# Patient Record
Sex: Female | Born: 1995
Health system: Southern US, Community
[De-identification: ages and names within clinical notes are randomized; demographics above are authoritative.]

## PROBLEM LIST (undated history)

## (undated) DIAGNOSIS — F909 Attention-deficit hyperactivity disorder, unspecified type: Secondary | ICD-10-CM

## (undated) DIAGNOSIS — E785 Hyperlipidemia, unspecified: Secondary | ICD-10-CM

## (undated) DIAGNOSIS — F32A Depression, unspecified: Secondary | ICD-10-CM

## (undated) DIAGNOSIS — Q796 Ehlers-Danlos syndrome, unspecified: Secondary | ICD-10-CM

## (undated) DIAGNOSIS — N83209 Unspecified ovarian cyst, unspecified side: Secondary | ICD-10-CM

## (undated) DIAGNOSIS — L309 Dermatitis, unspecified: Secondary | ICD-10-CM

## (undated) DIAGNOSIS — F419 Anxiety disorder, unspecified: Secondary | ICD-10-CM

## (undated) DIAGNOSIS — E282 Polycystic ovarian syndrome: Secondary | ICD-10-CM

## (undated) DIAGNOSIS — F319 Bipolar disorder, unspecified: Secondary | ICD-10-CM

## (undated) HISTORY — PX: KNEE SURGERY: SHX244

---
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
## 2017-01-31 ENCOUNTER — Encounter (HOSPITAL_COMMUNITY): Payer: Self-pay | Admitting: Emergency Medicine

## 2017-01-31 DIAGNOSIS — M542 Cervicalgia: Secondary | ICD-10-CM | POA: Insufficient documentation

## 2017-01-31 DIAGNOSIS — Z041 Encounter for examination and observation following transport accident: Secondary | ICD-10-CM | POA: Diagnosis not present

## 2017-01-31 DIAGNOSIS — M7918 Myalgia, other site: Secondary | ICD-10-CM | POA: Diagnosis not present

## 2017-01-31 DIAGNOSIS — R51 Headache: Secondary | ICD-10-CM | POA: Insufficient documentation
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
## 2017-02-01 ENCOUNTER — Emergency Department (HOSPITAL_COMMUNITY)
Admission: EM | Admit: 2017-02-01 | Discharge: 2017-02-01 | Disposition: A | Discharge status: Home / Self Care | Payer: BLUE CROSS/BLUE SHIELD | Attending: Emergency Medicine | Admitting: Emergency Medicine

## 2017-02-01 DIAGNOSIS — M7918 Myalgia, other site: Secondary | ICD-10-CM

## 2017-02-01 HISTORY — DX: Ehlers-Danlos syndrome, unspecified: Q79.60

## 2017-02-01 MED ORDER — CYCLOBENZAPRINE HCL 10 MG PO TABS: 10.0000 mg | ORAL_TABLET | Freq: Two times a day (BID) | ORAL | 0 refills | Status: DC | PRN

## 2017-02-01 MED ORDER — IBUPROFEN 600 MG PO TABS: 600.0000 mg | ORAL_TABLET | Freq: Four times a day (QID) | ORAL | 0 refills | Status: DC | PRN
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
## 2017-04-16 DIAGNOSIS — Z862 Personal history of diseases of the blood and blood-forming organs and certain disorders involving the immune mechanism: Secondary | ICD-10-CM

## 2017-04-16 HISTORY — DX: Personal history of diseases of the blood and blood-forming organs and certain disorders involving the immune mechanism: Z86.2

## 2017-04-19 DIAGNOSIS — F321 Major depressive disorder, single episode, moderate: Secondary | ICD-10-CM | POA: Diagnosis not present

## 2017-04-19 DIAGNOSIS — F4010 Social phobia, unspecified: Secondary | ICD-10-CM | POA: Diagnosis not present

## 2017-04-25 ENCOUNTER — Ambulatory Visit (INDEPENDENT_AMBULATORY_CARE_PROVIDER_SITE_OTHER): Payer: BLUE CROSS/BLUE SHIELD | Admitting: Family Medicine

## 2017-04-25 ENCOUNTER — Ambulatory Visit: Payer: Self-pay | Admitting: *Deleted

## 2017-04-25 ENCOUNTER — Encounter: Payer: Self-pay | Admitting: Family Medicine

## 2017-04-25 ENCOUNTER — Other Ambulatory Visit: Payer: Self-pay

## 2017-04-25 VITALS — BP 112/60 | HR 98 | Temp 98.3°F | Ht 71.65 in | Wt 174.0 lb

## 2017-04-25 DIAGNOSIS — F329 Major depressive disorder, single episode, unspecified: Secondary | ICD-10-CM

## 2017-04-25 DIAGNOSIS — F419 Anxiety disorder, unspecified: Secondary | ICD-10-CM

## 2017-04-25 DIAGNOSIS — F32A Depression, unspecified: Secondary | ICD-10-CM

## 2017-04-25 MED ORDER — VENLAFAXINE HCL 37.5 MG PO TABS: 37.5000 mg | ORAL_TABLET | Freq: Every day | ORAL | 1 refills | Status: DC

## 2017-04-25 MED ORDER — HYDROXYZINE HCL 25 MG PO TABS: 25.0000 mg | ORAL_TABLET | Freq: Every evening | ORAL | 1 refills | Status: DC | PRN

## 2017-04-25 NOTE — Telephone Encounter (Signed)
Pt states that she was prescribed effexor today and took her 1st dose at 1330 and about 10 minutes after taking the medication she had an episode of nausea and vomiting; she further states that now she feels shaky; nurse triage initiated and [place] and spoke with [place] and she will route request to Dr Pamella Pert and someone will call her back; when triage RN attempted to relay this information to the pt, her friend states that pt was vomiting again and the call disconnected; attempted to contact pt again at [phone] but phone went straight to voice mail but pt called back; conference call initiated with [place] and she verbalized that the pt was to have started the medication tomorrow after eating a good meal; pt states that all she had today was a poptart;  [place] instructed the pt, per Dr Pamella Pert, to take the medication again tomorrow and make sure she eats a good meal before taking the medication; pt verbalizes understanding and will call back if she has any other problems with the medication.  Reason for Disposition . Vomiting a prescription medication  Answer Assessment - Initial Assessment Questions 1. VOMITING SEVERITY: "How many times have you vomited in the past 24 hours?"     - MILD:  1 - 2 times/day    - MODERATE: 3 - 5 times/day, decreased oral intake without significant weight loss or symptoms of dehydration    - SEVERE: 6 or more times/day, vomits everything or nearly everything, with significant weight loss, symptoms of dehydration      mild 2. ONSET: "When did the vomiting begin?"      30 minutes after taking effexor for the first time  3. FLUIDS: "What fluids or food have you vomited up today?" "Have you been able to keep any fluids down?"     "Not much"; just liquid not food 4. ABDOMINAL PAIN: "Are your having any abdominal pain?" If yes : "How bad is it and what does it feel like?" (e.g., crampy, dull, intermittent, constant)      no 5. DIARRHEA: "Is there any diarrhea?" If so,  ask: "How many times today?"      no 6. CONTACTS: "Is there anyone else in the family with the same symptoms?"      no 7. CAUSE: "What do you think is causing your vomiting?"     ? First dose of medication  8. HYDRATION STATUS: "Any signs of dehydration?" (e.g., dry mouth [not only dry lips], too weak to stand) "When did you last urinate?"     no 9. OTHER SYMPTOMS: "Do you have any other symptoms?" (e.g., fever, headache, vertigo, vomiting blood or coffee grounds, recent head injury)     shaky 10. PREGNANCY: "Is there any chance you are pregnant?" "When was your last menstrual period?"      No pt is on birth control pills  Protocols used: [facility]

## 2017-04-25 NOTE — Patient Instructions (Signed)
     IF you received an x-ray today, you will receive an invoice from [facility]. Please contact [facility] at [phone] with questions or concerns regarding your invoice.   IF you received labwork today, you will receive an invoice from [facility]. Please contact [facility] at [phone] with questions or concerns regarding your invoice.   Our billing staff will not be able to assist you with questions regarding bills from these companies.  You will be contacted with the lab results as soon as they are available. The fastest way to get your results is to activate your My Chart account. Instructions are located on the last page of this paperwork. If you have not heard from us regarding the results in 2 weeks, please contact this office.        IF you received an x-ray today, you will receive an invoice from [facility]. Please contact [facility] at [phone] with questions or concerns regarding your invoice.   IF you received labwork today, you will receive an invoice from [facility]. Please contact [facility] at [phone] with questions or concerns regarding your invoice.   Our billing staff will not be able to assist you with questions regarding bills from these companies.  You will be contacted with the lab results as soon as they are available. The fastest way to get your results is to activate your My Chart account. Instructions are located on the last page of this paperwork. If you have not heard from us regarding the results in 2 weeks, please contact this office.     
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
## 2017-04-29 DIAGNOSIS — F4010 Social phobia, unspecified: Secondary | ICD-10-CM | POA: Diagnosis not present

## 2017-04-29 DIAGNOSIS — F321 Major depressive disorder, single episode, moderate: Secondary | ICD-10-CM | POA: Diagnosis not present

## 2017-05-08 DIAGNOSIS — F321 Major depressive disorder, single episode, moderate: Secondary | ICD-10-CM | POA: Diagnosis not present

## 2017-05-08 DIAGNOSIS — F4010 Social phobia, unspecified: Secondary | ICD-10-CM | POA: Diagnosis not present

## 2017-05-09 ENCOUNTER — Encounter: Payer: Self-pay | Admitting: Family Medicine

## 2017-05-09 ENCOUNTER — Ambulatory Visit (INDEPENDENT_AMBULATORY_CARE_PROVIDER_SITE_OTHER): Payer: BLUE CROSS/BLUE SHIELD | Admitting: Family Medicine

## 2017-05-09 ENCOUNTER — Telehealth: Payer: Self-pay | Admitting: General Practice

## 2017-05-09 ENCOUNTER — Other Ambulatory Visit: Payer: Self-pay

## 2017-05-09 VITALS — BP 104/78 | HR 96 | Temp 98.5°F | Ht 71.26 in | Wt 177.0 lb

## 2017-05-09 DIAGNOSIS — F329 Major depressive disorder, single episode, unspecified: Secondary | ICD-10-CM

## 2017-05-09 DIAGNOSIS — F32A Depression, unspecified: Secondary | ICD-10-CM | POA: Insufficient documentation

## 2017-05-09 DIAGNOSIS — M25562 Pain in left knee: Secondary | ICD-10-CM

## 2017-05-09 DIAGNOSIS — G8929 Other chronic pain: Secondary | ICD-10-CM | POA: Diagnosis not present

## 2017-05-09 DIAGNOSIS — Q796 Ehlers-Danlos syndrome, unspecified: Secondary | ICD-10-CM | POA: Insufficient documentation

## 2017-05-09 DIAGNOSIS — F419 Anxiety disorder, unspecified: Secondary | ICD-10-CM

## 2017-05-09 MED ORDER — CYCLOBENZAPRINE HCL 10 MG PO TABS: 10.0000 mg | ORAL_TABLET | Freq: Two times a day (BID) | ORAL | 0 refills | Status: DC | PRN

## 2017-05-09 MED ORDER — VENLAFAXINE HCL ER 75 MG PO CP24: 75.0000 mg | ORAL_CAPSULE | Freq: Every day | ORAL | 2 refills | Status: DC

## 2017-05-09 NOTE — Telephone Encounter (Signed)
Copied from [place] [id number]. Topic: Quick Communication - See Telephone Encounter >> May 09, 2017  5:32 PM Conception Chancy, NT wrote: CRM for notification. See Telephone encounter for:  05/09/17.  Pt was seen today and her Venlafaxine 37.5mg  was changed to Venlafaxine 75mg  and the pharmacy told her they would not have the 75mg  for about 2 days. She would like to know if she is supposed to take her 37.5x2 until she gets her 75mg . please contact patient,

## 2017-05-09 NOTE — Progress Notes (Signed)
1/24/20191:40 PM  [facility] 1995-10-11, 22 y.o. female [id number]  Chief Complaint  Patient presents with  . Follow-up    anxiety and depression. Having trouble sleeping, saying medication is not giving her the same reacation as first taking.     HPI:   Patient is a 22 y.o. female who presents today for followup on depression and anxiety.  She is tolerating effexor well as long as she takes it with food She notices a change in mood towards evening when morning dose starts to wear off She denies any worsening in anxiety She states that in general she continues to have mood swings She is escalating therapy to twice a week She is seriously considering taking a medical leave of absence from college. She states that her GPA is fine, 3.9, but the pressure is building, she feels very vulnerable and that she really needs to take time to work on her mental health She is also considering moving to [place] as she has good friends there, while she knows nobody here in [place] She would need a medical letter  She is also requesting refill of flexeril which really helps with her chronic left knee pain.   Depression screen [facility] 2/9 05/09/2017 04/25/2017  Decreased Interest 0 1  Down, Depressed, Hopeless 0 1  PHQ - 2 Score 0 2  Altered sleeping - 3  Tired, decreased energy - 3  Change in appetite - 0  Feeling bad or failure about yourself  - 3  Trouble concentrating - 2  Moving slowly or fidgety/restless - 3  Suicidal thoughts - 1  PHQ-9 Score - 17  Difficult doing work/chores - Very difficult    No Known Allergies  Prior to Admission medications   Medication Sig Start Date End Date Taking? Authorizing Provider  cyclobenzaprine (FLEXERIL) 10 MG tablet Take 1 tablet (10 mg total) by mouth 2 (two) times daily as needed for muscle spasms. 02/01/17  Yes Upstill, Nehemiah Settle, PA-C  hydrOXYzine (ATARAX/VISTARIL) 25 MG tablet Take 1 tablet (25 mg total) by mouth at bedtime as needed. 04/25/17   Yes Rutherford Guys, MD  norethindrone-ethinyl estradiol (MICROGESTIN,JUNEL,LOESTRIN) 1-20 MG-MCG tablet Take 1 tablet by mouth daily.   Yes [provider]  venlafaxine (EFFEXOR) 37.5 MG tablet Take 1 tablet (37.5 mg total) by mouth daily. 04/25/17  Yes Rutherford Guys, MD    Past Medical History:  Diagnosis Date  . Ehlers-Danlos disease     Past Surgical History:  Procedure Laterality Date  . KNEE SURGERY      Social History   Tobacco Use  . Smoking status: Never Smoker  . Smokeless tobacco: Never Used  Substance Use Topics  . Alcohol use: Yes    Comment: social    Family History  Problem Relation Age of Onset  . Hypertension Other   . Diabetes Other     ROS Per hpi  OBJECTIVE:  Blood pressure 104/78, pulse 96, temperature 98.5 F (36.9 C), temperature source Oral, height 5' 11.26" (1.81 m), weight 177 lb (80.3 kg), SpO2 96 %.  Physical Exam  Constitutional: She is oriented to person, place, and time and well-developed, well-nourished, and in no distress.  HENT:  Head: Normocephalic and atraumatic.  Mouth/Throat: Mucous membranes are normal.  Eyes: EOM are normal. Pupils are equal, round, and reactive to light. No scleral icterus.  Neck: Neck supple.  Pulmonary/Chest: Effort normal.  Neurological: She is alert and oriented to person, place, and time. Gait normal.  Skin: Skin is  warm and dry.  Psychiatric: Mood and affect normal.  Nursing note and vitals reviewed.    ASSESSMENT and PLAN  1. Anxiety and depression Will cont to titrate effexor. Advised patient to increase to 2 capsules AM after 2 weeks if needed. Continue with counseling. Patient will let me know if she needs a letter for her medical leave of absence.   2. Chronic pain of left knee  3. Ehlers-Danlos disease  Other orders - venlafaxine XR (EFFEXOR-XR) 75 MG 24 hr capsule; Take 1 capsule (75 mg total) by mouth daily with breakfast. - cyclobenzaprine (FLEXERIL) 10 MG tablet; Take  1 tablet (10 mg total) by mouth 2 (two) times daily as needed for muscle spasms.  Return in about 4 weeks (around 06/06/2017).    Rutherford Guys, MD Primary Care at [place], [place] [address] Ph.  [phone] Fax [phone]

## 2017-05-09 NOTE — Patient Instructions (Signed)
     IF you received an x-ray today, you will receive an invoice from [facility]. Please contact [facility] at [phone] with questions or concerns regarding your invoice.   IF you received labwork today, you will receive an invoice from [facility]. Please contact [facility] at [phone] with questions or concerns regarding your invoice.   Our billing staff will not be able to assist you with questions regarding bills from these companies.  You will be contacted with the lab results as soon as they are available. The fastest way to get your results is to activate your My Chart account. Instructions are located on the last page of this paperwork. If you have not heard from us regarding the results in 2 weeks, please contact this office.     

## 2017-05-10 DIAGNOSIS — F4010 Social phobia, unspecified: Secondary | ICD-10-CM | POA: Diagnosis not present

## 2017-05-10 DIAGNOSIS — F321 Major depressive disorder, single episode, moderate: Secondary | ICD-10-CM | POA: Diagnosis not present

## 2017-05-10 NOTE — Telephone Encounter (Signed)
Informed pt that Dr. Pamella Pert states that it is ok for her to take her previous medication until her new Rx is ready. She verbalized understanding.

## 2017-05-14 ENCOUNTER — Ambulatory Visit: Payer: Self-pay | Admitting: *Deleted

## 2017-05-14 DIAGNOSIS — F4010 Social phobia, unspecified: Secondary | ICD-10-CM | POA: Diagnosis not present

## 2017-05-14 DIAGNOSIS — F321 Major depressive disorder, single episode, moderate: Secondary | ICD-10-CM | POA: Diagnosis not present

## 2017-05-14 NOTE — Telephone Encounter (Signed)
I saw Dr. Pamella Pert last week.  We are working on adjusting my medications for anxiety and depression.    I'm seeing my therapist.   My therapist advised I take 2 weeks off to see if my medicine works. As a preventative due to being overwhelmed.   I need to Register with office of disability for [employer].     I need have my medical information sent to me.   The student health center can't give my  Information out to a  3rd party.    Can Dr. Pamella Pert send it to me via my E mail?    That would work for me.   I'm trying to get this done by the end of this week.  I'm wanting to take a break next week.   She is active on MyChart too.   I routed this note to Dr. Ardyth Gal nurse pool.

## 2017-05-15 ENCOUNTER — Telehealth: Payer: Self-pay | Admitting: Family Medicine

## 2017-05-15 NOTE — Telephone Encounter (Signed)
Will send pt a mychart message about this

## 2017-05-15 NOTE — Telephone Encounter (Signed)
mychart message sent to pt

## 2017-05-16 DIAGNOSIS — F4010 Social phobia, unspecified: Secondary | ICD-10-CM | POA: Diagnosis not present

## 2017-05-16 DIAGNOSIS — F321 Major depressive disorder, single episode, moderate: Secondary | ICD-10-CM | POA: Diagnosis not present

## 2017-05-16 NOTE — Telephone Encounter (Signed)
Copied from [place]. Topic: Inquiry >> May 16, 2017  1:30 PM Neva Seat wrote: Pt is needing a note that she spoke with Dr. Pamella Pert about in her last visit.  Pt said Dr. Pamella Pert stated she would write a note to give pt time away from school for 2 weeks to take care of her issues. Pt is REALLY NEEDING this note by the end of this week.    Please call pt back to let her know the status and when the note is ready.  Pt prefers an email note.

## 2017-05-16 NOTE — Telephone Encounter (Signed)
Copied from [place]. Topic: Inquiry >> May 16, 2017  1:30 PM Neva Seat wrote: Pt is needing a note that she spoke with Dr. Pamella Pert about in her last visit.  Pt said Dr. Pamella Pert stated she would write a note to give pt time away from school for 2 weeks to take care of her issues. Pt is REALLY NEEDING this note by the end of this week.    Please call pt back to let her know the status and when the note is ready.  Pt prefers an email note.  >> May 16, 2017  1:39 PM Neva Seat wrote: Summary: Pt needing an absent note for 2 weeks. Discussed at recent visit.  Pt is needing a note that she spoke with Dr. Pamella Pert about in her last visit. Pt said Dr. Pamella Pert stated she would write a note to give pt time away from school for 2 weeks to take care of her issues.  Pt is REALLY NEEDING this note by the end of this week.     Please call pt back to let her know the status and when the note is ready. Pt prefers an email note.

## 2017-05-17 ENCOUNTER — Encounter: Payer: Self-pay | Admitting: Family Medicine

## 2017-05-17 NOTE — Telephone Encounter (Signed)
Letter done and ready for pickup , patient notified by [facility]

## 2017-05-27 ENCOUNTER — Other Ambulatory Visit: Payer: Self-pay

## 2017-05-27 MED ORDER — HYDROXYZINE HCL 25 MG PO TABS: 25.0000 mg | ORAL_TABLET | Freq: Every evening | ORAL | 1 refills | Status: DC | PRN

## 2017-05-27 MED ORDER — VENLAFAXINE HCL ER 75 MG PO CP24: 75.0000 mg | ORAL_CAPSULE | Freq: Every day | ORAL | 0 refills | Status: DC

## 2017-05-27 MED ORDER — CYCLOBENZAPRINE HCL 10 MG PO TABS: 10.0000 mg | ORAL_TABLET | Freq: Two times a day (BID) | ORAL | 0 refills | Status: DC | PRN

## 2017-05-27 NOTE — Progress Notes (Unsigned)
Medication refill Hydroxyzine and Velafaxine given 90 day per Dr. Pamella Pert' approval

## 2017-06-03 ENCOUNTER — Encounter: Payer: Self-pay | Admitting: Nurse Practitioner

## 2017-06-03 ENCOUNTER — Ambulatory Visit: Payer: BLUE CROSS/BLUE SHIELD | Admitting: Nurse Practitioner

## 2017-06-03 ENCOUNTER — Ambulatory Visit: Payer: Self-pay | Admitting: *Deleted

## 2017-06-03 VITALS — BP 128/80 | HR 115 | Temp 98.5°F | Ht 71.26 in | Wt 182.0 lb

## 2017-06-03 DIAGNOSIS — F419 Anxiety disorder, unspecified: Secondary | ICD-10-CM

## 2017-06-03 DIAGNOSIS — F329 Major depressive disorder, single episode, unspecified: Secondary | ICD-10-CM | POA: Diagnosis not present

## 2017-06-03 DIAGNOSIS — R42 Dizziness and giddiness: Secondary | ICD-10-CM

## 2017-06-03 DIAGNOSIS — F32A Depression, unspecified: Secondary | ICD-10-CM

## 2017-06-03 MED ORDER — MECLIZINE HCL 12.5 MG PO TABS: 12.5000 mg | ORAL_TABLET | Freq: Two times a day (BID) | ORAL | 0 refills | Status: DC | PRN

## 2017-06-03 MED ORDER — NAPROXEN 375 MG PO TABS: 375.0000 mg | ORAL_TABLET | Freq: Two times a day (BID) | ORAL | 0 refills | Status: DC | PRN

## 2017-06-03 NOTE — Patient Instructions (Addendum)
Hold flexeril and vistaril  She declined any new medication at this time. She will like to discuss changes with pcp. I recommend for you to Take Effexor every other day till next appointment with pcp. Do not stop Effexor use drastically. It is important to wean off medication to minimize withdrawal symptoms. Please call pcp office for appt this week.  Maintain adequate oral hydration.  Dizziness Dizziness is a common problem. It is a feeling of unsteadiness or light-headedness. You may feel like you are about to faint. Dizziness can lead to injury if you stumble or fall. Anyone can become dizzy, but dizziness is more common in older adults. This condition can be caused by a number of things, including medicines, dehydration, or illness. Follow these instructions at home: Eating and drinking  Drink enough fluid to keep your urine clear or pale yellow. This helps to keep you from becoming dehydrated. Try to drink more clear fluids, such as water.  Do not drink alcohol.  Limit your caffeine intake if told to do so by your health care provider. Check ingredients and nutrition facts to see if a food or beverage contains caffeine.  Limit your salt (sodium) intake if told to do so by your health care provider. Check ingredients and nutrition facts to see if a food or beverage contains sodium. Activity  Avoid making quick movements. ? Rise slowly from chairs and steady yourself until you feel okay. ? In the morning, first sit up on the side of the bed. When you feel okay, stand slowly while you hold onto something until you know that your balance is fine.  If you need to stand in one place for a long time, move your legs often. Tighten and relax the muscles in your legs while you are standing.  Do not drive or use heavy machinery if you feel dizzy.  Avoid bending down if you feel dizzy. Place items in your home so that they are easy for you to reach without leaning over. Lifestyle  Do not  use any products that contain nicotine or tobacco, such as cigarettes and e-cigarettes. If you need help quitting, ask your health care provider.  Try to reduce your stress level by using methods such as yoga or meditation. Talk with your health care provider if you need help to manage your stress. General instructions  Watch your dizziness for any changes.  Take over-the-counter and prescription medicines only as told by your health care provider. Talk with your health care provider if you think that your dizziness is caused by a medicine that you are taking.  Tell a friend or a family member that you are feeling dizzy. If he or she notices any changes in your behavior, have this person call your health care provider.  Keep all follow-up visits as told by your health care provider. This is important. Contact a health care provider if:  Your dizziness does not go away.  Your dizziness or light-headedness gets worse.  You feel nauseous.  You have reduced hearing.  You have new symptoms.  You are unsteady on your feet or you feel like the room is spinning. Get help right away if:  You vomit or have diarrhea and are unable to eat or drink anything.  You have problems talking, walking, swallowing, or using your arms, hands, or legs.  You feel generally weak.  You are not thinking clearly or you have trouble forming sentences. It may take a friend or family member to notice  this.  You have chest pain, abdominal pain, shortness of breath, or sweating.  Your vision changes.  You have any bleeding.  You have a severe headache.  You have neck pain or a stiff neck.  You have a fever. These symptoms may represent a serious problem that is an emergency. Do not wait to see if the symptoms will go away. Get medical help right away. Call your local emergency services (911 in the [place] Do not drive yourself to the hospital. Summary  Dizziness is a feeling of unsteadiness or  light-headedness. This condition can be caused by a number of things, including medicines, dehydration, or illness.  Anyone can become dizzy, but dizziness is more common in older adults.  Drink enough fluid to keep your urine clear or pale yellow. Do not drink alcohol.  Avoid making quick movements if you feel dizzy. Monitor your dizziness for any changes. This information is not intended to replace advice given to you by your health care provider. Make sure you discuss any questions you have with your health care provider. Document Released: 09/26/2000 Document Revised: 05/05/2016 Document Reviewed: 05/05/2016 Elsevier Interactive Patient Education  [employer].

## 2017-06-03 NOTE — Telephone Encounter (Signed)
Pt reports lightheadedness x 3 days. Reports H/O hypoglycemia but states "feels different."  Does take Effexor XR but has not taken since Friday. States worse with positional changes, constant. "Feels like my vision has to catch up when I turn my head." Denies SOB, nausea, headache, no cold, sinus symptoms. States is staying hydrated. No openings today at [employer].  Declined UC.  Appt. made for today with Zetta Bills, NP at [facility]. Care advice given per protocol. Instructed to call back if symptoms worsen.  Reason for Disposition . [1] MODERATE dizziness (e.g., interferes with normal activities) AND [2] has NOT been evaluated by physician for this  (Exception: dizziness caused by heat exposure, sudden standing, or poor fluid intake)  Answer Assessment - Initial Assessment Questions 1. DESCRIPTION: "Describe your dizziness."    "Lightheaded 2. LIGHTHEADED: "Do you feel lightheaded?" (e.g., somewhat faint, woozy, weak upon standing)     Positional 3. VERTIGO: "Do you feel like either you or the room is spinning or tilting?" (i.e. vertigo)     No 4. SEVERITY: "How bad is it?"  "Do you feel like you are going to faint?" "Can you stand and walk?"   - MILD - walking normally   - MODERATE - interferes with normal activities (e.g., work, school)    - SEVERE - unable to stand, requires support to walk, feels like passing out now.      Moderate 5. ONSET:  "When did the dizziness begin?"     3 days ago 6. AGGRAVATING FACTORS: "Does anything make it worse?" (e.g., standing, change in head position)    Worse sitting to standing 7. HEART RATE: "Can you tell me your heart rate?" "How many beats in 15 seconds?"  (Note: not all patients can do this)       170 per Apple Watch, she is walking at this time. Had pt rest, 123. 8. CAUSE: "What do you think is causing the dizziness?"     Unsure 9. RECURRENT SYMPTOM: "Have you had dizziness before?" If so, ask: "When was the last time?" "What happened  that time?"    Hypoglycemic episodes; "Doesn't feel like that." 10. OTHER SYMPTOMS: "Do you have any other symptoms?" (e.g., fever, chest pain, vomiting, diarrhea, bleeding)       No 11. PREGNANCY: "Is there any chance you are pregnant?" "When was your last menstrual period?"       No  Protocols used: DIZZINESS [facility]

## 2017-06-03 NOTE — Progress Notes (Signed)
Subjective:  Patient ID: Linda Shea, female    DOB: 1995-08-31  Age: 22 y.o. MRN: [id number]  CC: Dizziness (dizziness 3 day/felt like drunk/headache--today- hx low surgar-didnt take venlafaine 2 day--not sure if this is the cause/taking birth control---no period)  Dizziness  This is a new problem. The current episode started in the past 7 days. The problem occurs intermittently. The problem has been unchanged. Associated symptoms include headaches and vertigo. Pertinent negatives include no abdominal pain, anorexia, arthralgias, change in bowel habit, chest pain, chills, congestion, coughing, diaphoresis, fatigue, fever, joint swelling, myalgias, nausea, neck pain, numbness, rash, sore throat, swollen glands, urinary symptoms, visual change, vomiting or weakness. The symptoms are aggravated by twisting (head movement). She has tried rest for the symptoms. The treatment provided significant relief.  Onset of symptoms on FRiday Describes as Room spinning and unsteadiness. Effexor dose increased 3weeks ago, she skipped dose on Saturday and sunday, then took med today.  Depression and Anxiety: Previous use of prozac and celexa.. Seen by psychology at this time. Still to make appt with psychiatry. FH of depression, OCD, anxiety (mother, father, and sister) Hx of suicide attempt. No hallucination. Denies any SI or HI Denies any previous hospitalization. She is hesitant about trying another medication at this time.  Outpatient Medications Prior to Visit  Medication Sig Dispense Refill  . cyclobenzaprine (FLEXERIL) 10 MG tablet Take 1 tablet (10 mg total) by mouth 2 (two) times daily as needed for muscle spasms. 180 tablet 0  . hydrOXYzine (ATARAX/VISTARIL) 25 MG tablet Take 1 tablet (25 mg total) by mouth at bedtime as needed. 90 tablet 1  . norethindrone-ethinyl estradiol (MICROGESTIN,JUNEL,LOESTRIN) 1-20 MG-MCG tablet Take 1 tablet by mouth daily.    Marland Kitchen venlafaxine XR (EFFEXOR-XR) 75 MG 24  hr capsule Take 1 capsule (75 mg total) by mouth daily with breakfast. 90 capsule 0   No facility-administered medications prior to visit.     ROS See HPI  Objective:  BP 128/80   Pulse (!) 115   Temp 98.5 F (36.9 C)   Ht 5' 11.26" (1.81 m)   Wt 182 lb (82.6 kg)   SpO2 98%   BMI 25.20 kg/m   BP Readings from Last 3 Encounters:  06/03/17 128/80 (91 %, Z = 1.32 /  91 %, Z = 1.37)*  05/09/17 104/78 (8 %, Z = -1.42 /  90 %, Z = 1.29)*  04/25/17 112/60 (35 %, Z = -0.38 /  13 %, Z = -1.15)*   *BP percentiles are based on the August 2017 AAP Clinical Practice Guideline for girls    Wt Readings from Last 3 Encounters:  06/03/17 182 lb (82.6 kg) (95 %, Z= 1.65)*  05/09/17 177 lb (80.3 kg) (94 %, Z= 1.56)*  04/25/17 174 lb (78.9 kg) (93 %, Z= 1.50)*   * Growth percentiles are based on CDC (Girls, 2-20 Years) data.    Physical Exam  Constitutional: She is oriented to person, place, and time.  HENT:  Right Ear: External ear normal. No mastoid tenderness. Tympanic membrane is not injected. No middle ear effusion.  Left Ear: External ear normal. No mastoid tenderness. Tympanic membrane is not injected.  No middle ear effusion.  Nose: Nose normal.  Mouth/Throat: Oropharynx is clear and moist.  Eyes: Conjunctivae and EOM are normal. Pupils are equal, round, and reactive to light.  Neck: Normal range of motion. Neck supple. No thyromegaly present.  Cardiovascular: Normal rate, regular rhythm and normal heart sounds.  Pulmonary/Chest: Effort normal  and breath sounds normal.  Lymphadenopathy:    She has no cervical adenopathy.  Neurological: She is alert and oriented to person, place, and time.  No nystagmus noted. Reports vertigo with head movement to right.  Skin: Skin is warm and dry.  Psychiatric: Thought content normal. Her mood appears anxious. Her affect is labile. Thought content is not paranoid and not delusional. Cognition and memory are normal. She exhibits a depressed  mood. She expresses no homicidal and no suicidal ideation.  Tearful intermittently during visit.  Vitals reviewed.   No results found for: WBC, HGB, HCT, PLT, GLUCOSE, CHOL, TRIG, HDL, LDLDIRECT, LDLCALC, ALT, AST, NA, K, CL, CREATININE, BUN, CO2, TSH, PSA, INR, GLUF, HGBA1C, MICROALBUR  No results found.  Assessment & Plan:   Dior was seen today for dizziness.  Diagnoses and all orders for this visit:  Postural dizziness -     naproxen (NAPROSYN) 375 MG tablet; Take 1 tablet (375 mg total) by mouth 2 (two) times daily between meals as needed for headache. -     meclizine (ANTIVERT) 12.5 MG tablet; Take 1 tablet (12.5 mg total) by mouth 2 (two) times daily as needed for dizziness.  Anxiety and depression   I am having Linda Shea start on naproxen and meclizine. I am also having her maintain her norethindrone-ethinyl estradiol, cyclobenzaprine, hydrOXYzine, and venlafaxine XR.  Meds ordered this encounter  Medications  . naproxen (NAPROSYN) 375 MG tablet    Sig: Take 1 tablet (375 mg total) by mouth 2 (two) times daily between meals as needed for headache.    Dispense:  14 tablet    Refill:  0    Order Specific Question:   Supervising Provider    Answer:   Lucille Passy [[id number]]  . meclizine (ANTIVERT) 12.5 MG tablet    Sig: Take 1 tablet (12.5 mg total) by mouth 2 (two) times daily as needed for dizziness.    Dispense:  14 tablet    Refill:  0    Order Specific Question:   Supervising Provider    Answer:   Lucille Passy [[id number]]    Follow-up: Return if symptoms worsen or fail to improve.  Wilfred Lacy, NP

## 2017-06-04 ENCOUNTER — Encounter: Payer: Self-pay | Admitting: Nurse Practitioner

## 2017-06-06 ENCOUNTER — Encounter: Payer: Self-pay | Admitting: Nurse Practitioner

## 2017-06-08 DIAGNOSIS — M9901 Segmental and somatic dysfunction of cervical region: Secondary | ICD-10-CM | POA: Diagnosis not present

## 2017-06-08 DIAGNOSIS — M9904 Segmental and somatic dysfunction of sacral region: Secondary | ICD-10-CM | POA: Diagnosis not present

## 2017-06-08 DIAGNOSIS — M5386 Other specified dorsopathies, lumbar region: Secondary | ICD-10-CM | POA: Diagnosis not present

## 2017-06-08 DIAGNOSIS — M9903 Segmental and somatic dysfunction of lumbar region: Secondary | ICD-10-CM | POA: Diagnosis not present

## 2017-06-10 DIAGNOSIS — M9901 Segmental and somatic dysfunction of cervical region: Secondary | ICD-10-CM | POA: Diagnosis not present

## 2017-06-10 DIAGNOSIS — M9904 Segmental and somatic dysfunction of sacral region: Secondary | ICD-10-CM | POA: Diagnosis not present

## 2017-06-10 DIAGNOSIS — M5386 Other specified dorsopathies, lumbar region: Secondary | ICD-10-CM | POA: Diagnosis not present

## 2017-06-10 DIAGNOSIS — M9903 Segmental and somatic dysfunction of lumbar region: Secondary | ICD-10-CM | POA: Diagnosis not present

## 2017-06-11 ENCOUNTER — Ambulatory Visit: Payer: BLUE CROSS/BLUE SHIELD | Admitting: Family Medicine

## 2017-06-11 ENCOUNTER — Encounter: Payer: Self-pay | Admitting: Family Medicine

## 2017-06-11 ENCOUNTER — Other Ambulatory Visit: Payer: Self-pay

## 2017-06-11 VITALS — BP 118/74 | HR 88 | Temp 98.6°F | Ht 71.0 in | Wt 185.2 lb

## 2017-06-11 DIAGNOSIS — F419 Anxiety disorder, unspecified: Secondary | ICD-10-CM | POA: Diagnosis not present

## 2017-06-11 DIAGNOSIS — M9904 Segmental and somatic dysfunction of sacral region: Secondary | ICD-10-CM | POA: Diagnosis not present

## 2017-06-11 DIAGNOSIS — M9901 Segmental and somatic dysfunction of cervical region: Secondary | ICD-10-CM | POA: Diagnosis not present

## 2017-06-11 DIAGNOSIS — F329 Major depressive disorder, single episode, unspecified: Secondary | ICD-10-CM

## 2017-06-11 DIAGNOSIS — F32A Depression, unspecified: Secondary | ICD-10-CM

## 2017-06-11 DIAGNOSIS — M5386 Other specified dorsopathies, lumbar region: Secondary | ICD-10-CM | POA: Diagnosis not present

## 2017-06-11 DIAGNOSIS — M9903 Segmental and somatic dysfunction of lumbar region: Secondary | ICD-10-CM | POA: Diagnosis not present

## 2017-06-11 NOTE — Patient Instructions (Addendum)
  No changes to medication at this time, defer to psychiatry    IF you received an x-ray today, you will receive an invoice from [facility]. Please contact [facility] at [phone] with questions or concerns regarding your invoice.   IF you received labwork today, you will receive an invoice from [facility]. Please contact [facility] at [phone] with questions or concerns regarding your invoice.   Our billing staff will not be able to assist you with questions regarding bills from these companies.  You will be contacted with the lab results as soon as they are available. The fastest way to get your results is to activate your My Chart account. Instructions are located on the last page of this paperwork. If you have not heard from [place] regarding the results in 2 weeks, please contact this office.

## 2017-06-11 NOTE — Progress Notes (Signed)
2/26/20194:05 PM  [facility] 1995/11/30, 22 y.o. female [id number]  Chief Complaint  Patient presents with  . Follow-up    WANTING AN ANXIETY MED THAT DOES NOT MAKE HER DROWSY, ALSO HAVING VERTIGO    HPI:   Patient is a 22 y.o. female with past medical history significant for depression and anxiety who presents today for followup  Tolerating effexor well, did notice vertigo when she missed 2 doses in a row She feels that her depression is much better, not tearful, has not had any SI thoughts in over a week However she feels that her anxiety is worse, feels it is easier to become anxious Hydroxyzine helps well at night, sleeps very well, however cant take it during the day Is going to counseling Sees psychiatry tomorrow to establish care Is back in school after 2 weeks, doing ok, still has days when she feels that her world will crumble if she even gets up to brush ger teeth Started to see chiropractor for her back, going well  Depression screen [facility] 2/9 06/11/2017 05/09/2017 04/25/2017  Decreased Interest 0 0 1  Down, Depressed, Hopeless 0 0 1  PHQ - 2 Score 0 0 2  Altered sleeping - - 3  Tired, decreased energy - - 3  Change in appetite - - 0  Feeling bad or failure about yourself  - - 3  Trouble concentrating - - 2  Moving slowly or fidgety/restless - - 3  Suicidal thoughts - - 1  PHQ-9 Score - - 17  Difficult doing work/chores - - Very difficult    No Known Allergies  Prior to Admission medications   Medication Sig Start Date End Date Taking? Authorizing Provider  cyclobenzaprine (FLEXERIL) 10 MG tablet Take 1 tablet (10 mg total) by mouth 2 (two) times daily as needed for muscle spasms. 05/27/17  Yes Rutherford Guys, MD  hydrOXYzine (ATARAX/VISTARIL) 25 MG tablet Take 1 tablet (25 mg total) by mouth at bedtime as needed. 05/27/17  Yes Rutherford Guys, MD  norethindrone-ethinyl estradiol (MICROGESTIN,JUNEL,LOESTRIN) 1-20 MG-MCG tablet Take 1 tablet by mouth daily.    Yes [provider]  venlafaxine XR (EFFEXOR-XR) 75 MG 24 hr capsule Take 1 capsule (75 mg total) by mouth daily with breakfast. 05/27/17  Yes Rutherford Guys, MD    Past Medical History:  Diagnosis Date  . Ehlers-Danlos disease     Past Surgical History:  Procedure Laterality Date  . KNEE SURGERY      Social History   Tobacco Use  . Smoking status: Never Smoker  . Smokeless tobacco: Never Used  Substance Use Topics  . Alcohol use: Yes    Comment: social    Family History  Problem Relation Age of Onset  . Hypertension Other   . Diabetes Other     ROS Per hpi  OBJECTIVE:  Blood pressure 118/74, pulse 88, temperature 98.6 F (37 C), temperature source Oral, height 5\' 11"  (1.803 m), weight 185 lb 3.2 oz (84 kg), SpO2 98 %.  Physical Exam  Constitutional: She is oriented to person, place, and time and well-developed, well-nourished, and in no distress.  HENT:  Head: Normocephalic and atraumatic.  Mouth/Throat: Mucous membranes are normal.  Eyes: EOM are normal. Pupils are equal, round, and reactive to light. No scleral icterus.  Neck: Neck supple.  Pulmonary/Chest: Effort normal.  Neurological: She is alert and oriented to person, place, and time. Gait normal.  Skin: Skin is warm and dry.  Psychiatric: Mood and affect  normal.  Nursing note and vitals reviewed.    ASSESSMENT and PLAN  1. Anxiety and depression Depression is better, anxiety is not. Sees psychiatry tomorrow, defer to them for further evaluation and treatment. Continue with counseling.   Return in about 6 weeks (around 07/23/2017).    Rutherford Guys, MD Primary Care at [place], [place] [address] Ph.  [phone] Fax [phone]

## 2017-06-13 DIAGNOSIS — F4010 Social phobia, unspecified: Secondary | ICD-10-CM | POA: Diagnosis not present

## 2017-06-13 DIAGNOSIS — F321 Major depressive disorder, single episode, moderate: Secondary | ICD-10-CM | POA: Diagnosis not present

## 2017-06-13 DIAGNOSIS — M5386 Other specified dorsopathies, lumbar region: Secondary | ICD-10-CM | POA: Diagnosis not present

## 2017-06-13 DIAGNOSIS — M9904 Segmental and somatic dysfunction of sacral region: Secondary | ICD-10-CM | POA: Diagnosis not present

## 2017-06-13 DIAGNOSIS — M9903 Segmental and somatic dysfunction of lumbar region: Secondary | ICD-10-CM | POA: Diagnosis not present

## 2017-06-13 DIAGNOSIS — M9901 Segmental and somatic dysfunction of cervical region: Secondary | ICD-10-CM | POA: Diagnosis not present

## 2017-06-27 DIAGNOSIS — F4010 Social phobia, unspecified: Secondary | ICD-10-CM | POA: Diagnosis not present

## 2017-06-27 DIAGNOSIS — F321 Major depressive disorder, single episode, moderate: Secondary | ICD-10-CM | POA: Diagnosis not present

## 2017-07-03 ENCOUNTER — Telehealth: Payer: Self-pay | Admitting: Family Medicine

## 2017-07-03 NOTE — Telephone Encounter (Signed)
Called pt yesterday 4-19 and rescheduled her appt with Dr. Pamella Pert to 4-13 at 10:40. Advised pt of building number, time requests and late policy

## 2017-07-04 ENCOUNTER — Ambulatory Visit: Payer: Self-pay | Admitting: *Deleted

## 2017-07-04 DIAGNOSIS — F321 Major depressive disorder, single episode, moderate: Secondary | ICD-10-CM | POA: Diagnosis not present

## 2017-07-04 DIAGNOSIS — M5386 Other specified dorsopathies, lumbar region: Secondary | ICD-10-CM | POA: Diagnosis not present

## 2017-07-04 DIAGNOSIS — M9903 Segmental and somatic dysfunction of lumbar region: Secondary | ICD-10-CM | POA: Diagnosis not present

## 2017-07-04 DIAGNOSIS — F4010 Social phobia, unspecified: Secondary | ICD-10-CM | POA: Diagnosis not present

## 2017-07-04 DIAGNOSIS — M9904 Segmental and somatic dysfunction of sacral region: Secondary | ICD-10-CM | POA: Diagnosis not present

## 2017-07-04 DIAGNOSIS — M9901 Segmental and somatic dysfunction of cervical region: Secondary | ICD-10-CM | POA: Diagnosis not present

## 2017-07-04 NOTE — Telephone Encounter (Signed)
I returned her call to discuss her "digestive issues".   See triage notes below.    Starting about 2-3 weeks ago she is experiencing bloating and gas after every time she eats and then 20 minutes later has to have a BM.  She says she thinks she is lactose intolerant and so avoids lactose.   She was wondering if she is gluten intolerant.  She has not tried any OTC remedies for the gas and bloating.    After triaging her and was going to make her an appt she mentioned,  "I have an appt tomorrow with Dr. Pamella Pert for my digestive issues".   I confirmed the appt with her.  I encouraged her to talk with Dr. Pamella Pert regarding her digestive issues when she comes in tomorrow.    She was agreeable to this. Reason for Disposition . Abdominal pain is a chronic symptom (recurrent or ongoing AND present > 4 weeks)  Answer Assessment - Initial Assessment Questions 1. LOCATION: "Where does it hurt?"      I have bloating and gas every time after I eat.   Then 20 minutes later I need to go to the bathroom with BM.   This started about 2-3 weeks. 2. RADIATION: "Does the pain shoot anywhere else?" (e.g., chest, back)     I get real bloated. 3. ONSET: "When did the pain begin?" (e.g., minutes, hours or days ago)      2-3 weeks ago.   No diet changes or new medications. 4. SUDDEN: "Gradual or sudden onset?"     Just have bloating that's bad.   And gas. 5. PATTERN "Does the pain come and go, or is it constant?"    - If constant: "Is it getting better, staying the same, or worsening?"      (Note: Constant means the pain never goes away completely; most serious pain is constant and it progresses)     - If intermittent: "How long does it last?" "Do you have pain now?"     (Note: Intermittent means the pain goes away completely between bouts)     I have not tried anything OTC for the bloating or gas.    6. SEVERITY: "How bad is the pain?"  (e.g., Scale 1-10; mild, moderate, or severe)   - MILD (1-3): doesn't interfere  with normal activities, abdomen soft and not tender to touch    - MODERATE (4-7): interferes with normal activities or awakens from sleep, tender to touch    - SEVERE (8-10): excruciating pain, doubled over, unable to do any normal activities      No 7. RECURRENT SYMPTOM: "Have you ever had this type of abdominal pain before?" If so, ask: "When was the last time?" and "What happened that time?"      I know lactose intolerant.    I avoid lactose.   8. CAUSE: "What do you think is causing the abdominal pain?"     It only happens after I eat.     9. RELIEVING/AGGRAVATING FACTORS: "What makes it better or worse?" (e.g., movement, antacids, bowel movement)     I have not tried anything over the counter. 10. OTHER SYMPTOMS: "Has there been any vomiting, diarrhea, constipation, or urine problems?"       Every time after I eat I have to go have  BM 20 minutes later. 11. PREGNANCY: "Is there any chance you are pregnant?" "When was your last menstrual period?"       No.  I take birth control  Protocols used: ABDOMINAL PAIN - [facility]

## 2017-07-05 ENCOUNTER — Ambulatory Visit: Payer: BLUE CROSS/BLUE SHIELD | Admitting: Family Medicine

## 2017-07-11 DIAGNOSIS — F321 Major depressive disorder, single episode, moderate: Secondary | ICD-10-CM | POA: Diagnosis not present

## 2017-07-11 DIAGNOSIS — F4010 Social phobia, unspecified: Secondary | ICD-10-CM | POA: Diagnosis not present

## 2017-07-18 DIAGNOSIS — F321 Major depressive disorder, single episode, moderate: Secondary | ICD-10-CM | POA: Diagnosis not present

## 2017-07-18 DIAGNOSIS — F4010 Social phobia, unspecified: Secondary | ICD-10-CM | POA: Diagnosis not present

## 2017-07-23 ENCOUNTER — Ambulatory Visit: Payer: BLUE CROSS/BLUE SHIELD | Admitting: Family Medicine

## 2017-07-27 ENCOUNTER — Ambulatory Visit (INDEPENDENT_AMBULATORY_CARE_PROVIDER_SITE_OTHER): Payer: BLUE CROSS/BLUE SHIELD | Admitting: Family Medicine

## 2017-07-27 ENCOUNTER — Encounter: Payer: Self-pay | Admitting: Family Medicine

## 2017-07-27 VITALS — BP 118/68 | HR 97 | Temp 98.7°F | Resp 16 | Ht 70.0 in | Wt 198.0 lb

## 2017-07-27 DIAGNOSIS — F32A Depression, unspecified: Secondary | ICD-10-CM

## 2017-07-27 DIAGNOSIS — F419 Anxiety disorder, unspecified: Secondary | ICD-10-CM | POA: Diagnosis not present

## 2017-07-27 DIAGNOSIS — R1030 Lower abdominal pain, unspecified: Secondary | ICD-10-CM | POA: Diagnosis not present

## 2017-07-27 DIAGNOSIS — Q796 Ehlers-Danlos syndrome, unspecified: Secondary | ICD-10-CM

## 2017-07-27 DIAGNOSIS — F329 Major depressive disorder, single episode, unspecified: Secondary | ICD-10-CM

## 2017-07-27 MED ORDER — VENLAFAXINE HCL ER 75 MG PO CP24: 75.0000 mg | ORAL_CAPSULE | Freq: Every day | ORAL | 1 refills | Status: DC

## 2017-07-27 NOTE — Patient Instructions (Signed)
     IF you received an x-ray today, you will receive an invoice from [facility]. Please contact [facility] at [phone] with questions or concerns regarding your invoice.   IF you received labwork today, you will receive an invoice from [facility]. Please contact [facility] at [phone] with questions or concerns regarding your invoice.   Our billing staff will not be able to assist you with questions regarding bills from these companies.  You will be contacted with the lab results as soon as they are available. The fastest way to get your results is to activate your My Chart account. Instructions are located on the last page of this paperwork. If you have not heard from us regarding the results in 2 weeks, please contact this office.     

## 2017-07-27 NOTE — Progress Notes (Signed)
4/13/201910:52 AM  Linda Shea [birth year], 22 y.o. female [id number]  Chief Complaint  Patient presents with  . Depression    and anxiety follow up - med review  . Referral    ehlers-danlos     HPI:   Patient is a 22 y.o. female with past medical history significant for ehlers-danlos, depression and anxiety who presents today for followup.  1. Depression and aniety - she is tolerating effexor well regarding depression, however anxiety remains not well controlled. She continues to work with her therapist and they are both wondering about bipolar. She does see psych later this month [facility]. She needs a refill of effexor until she sees psych. PHQ9 and GAD7 noted. She needs letter requesting an extension of accommodations at school.   2. Ehlers-danlos - she is requesting referral to [facility]. She is having more issues with it. Her right wrist is becoming weak and painful with simple tasks. She is also having issues with her abdomen which she is not sure if related. She was diagnosed in the 7th grade. She thinks by a geneticist but otherwise has never been under somebody's care.   3. One month of lower abd pain and bloating followed by diarrhea with almost every meal. She denies any radiation of pain, blood or mucous in stool. Pain resolves after BM. Patient denies any fever, chills, nausea, vomiting. She states that even simple salads are given her problems. She denies any weight loss. She has occ heartburn, but not worsening. She states that she has been reading that Ehlers-danlos can cause GI issues. She is also wondering about IBS and would like to explore dietary changes. She denies ever having any sign GI issues prior.  Depression screen [facility] 2/9 07/27/2017 06/11/2017 05/09/2017  Decreased Interest 2 0 0  Down, Depressed, Hopeless 2 0 0  PHQ - 2 Score 4 0 0  Altered sleeping 3 - -  Tired, decreased energy 2 - -  Change in appetite 2 - -  Feeling bad or failure  about yourself  1 - -  Trouble concentrating 2 - -  Moving slowly or fidgety/restless 1 - -  Suicidal thoughts 0 - -  PHQ-9 Score 15 - -  Difficult doing work/chores Very difficult - -   GAD 7 : Generalized Anxiety Score 07/27/2017  Nervous, Anxious, on Edge 3  Control/stop worrying 2  Worry too much - different things 2  Trouble relaxing 1  Restless 3  Easily annoyed or irritable 2  Afraid - awful might happen 2  Total GAD 7 Score 15  Anxiety Difficulty Somewhat difficult     No Known Allergies  Prior to Admission medications   Medication Sig Start Date End Date Taking? Authorizing Provider  cyclobenzaprine (FLEXERIL) 10 MG tablet Take 1 tablet (10 mg total) by mouth 2 (two) times daily as needed for muscle spasms. 05/27/17  Yes Rutherford Guys, MD  hydrOXYzine (ATARAX/VISTARIL) 25 MG tablet Take 1 tablet (25 mg total) by mouth at bedtime as needed. 05/27/17  Yes Rutherford Guys, MD  meclizine (ANTIVERT) 12.5 MG tablet Take 1 tablet (12.5 mg total) by mouth 2 (two) times daily as needed for dizziness. 06/03/17  Yes Nche, Charlene Brooke, NP  naproxen (NAPROSYN) 375 MG tablet Take 1 tablet (375 mg total) by mouth 2 (two) times daily between meals as needed for headache. 06/03/17  Yes Nche, Charlene Brooke, NP  norethindrone-ethinyl estradiol (MICROGESTIN,JUNEL,LOESTRIN) 1-20 MG-MCG tablet Take 1 tablet by mouth  daily.   Yes [provider]  venlafaxine XR (EFFEXOR-XR) 75 MG 24 hr capsule Take 1 capsule (75 mg total) by mouth daily with breakfast. 05/27/17  Yes Rutherford Guys, MD    Past Medical History:  Diagnosis Date  . Ehlers-Danlos disease     Past Surgical History:  Procedure Laterality Date  . KNEE SURGERY      Social History   Tobacco Use  . Smoking status: Never Smoker  . Smokeless tobacco: Never Used  Substance Use Topics  . Alcohol use: Yes    Comment: social    Family History  Problem Relation Age of Onset  . Hypertension Other   . Diabetes Other       ROS Per hpi  OBJECTIVE:  Blood pressure 118/68, pulse 97, temperature 98.7 F (37.1 C), temperature source Oral, resp. rate 16, height 5\' 10"  (1.778 m), weight 198 lb (89.8 kg), SpO2 96 %.  Physical Exam  Constitutional: She is oriented to person, place, and time.  HENT:  Head: Normocephalic and atraumatic.  Mouth/Throat: Oropharynx is clear and moist. No oropharyngeal exudate.  Eyes: Pupils are equal, round, and reactive to light. EOM are normal. No scleral icterus.  Neck: Neck supple.  Cardiovascular: Normal rate, regular rhythm and normal heart sounds. Exam reveals no gallop and no friction rub.  No murmur heard. Pulmonary/Chest: Effort normal and breath sounds normal. She has no wheezes. She has no rales.  Abdominal: Soft. Bowel sounds are normal. She exhibits no distension. There is no hepatosplenomegaly. There is tenderness in the right lower quadrant, suprapubic area and left lower quadrant. There is no rebound and no guarding.  Musculoskeletal: She exhibits no edema.  Neurological: She is alert and oriented to person, place, and time.  Skin: Skin is warm and dry.     ASSESSMENT and PLAN  1. Anxiety and depression Patient is stable, has upcoming appt with psychiatry, will refill Effexor, defer further treatment to psych   2. Ehlers-Danlos disease - Ambulatory referral to Rheumatology  3. Lower abdominal pain Benign exam, no red flags, will do trial of low fodmap diet.  Other orders - venlafaxine XR (EFFEXOR-XR) 75 MG 24 hr capsule; Take 1 capsule (75 mg total) by mouth daily with breakfast.  Return for after rheum.    Rutherford Guys, MD [facility] at [facility] [place], [place] [address] Ph.  [phone] Fax [phone]

## 2017-08-01 DIAGNOSIS — F4010 Social phobia, unspecified: Secondary | ICD-10-CM | POA: Diagnosis not present

## 2017-08-01 DIAGNOSIS — F321 Major depressive disorder, single episode, moderate: Secondary | ICD-10-CM | POA: Diagnosis not present

## 2017-08-05 DIAGNOSIS — F3340 Major depressive disorder, recurrent, in remission, unspecified: Secondary | ICD-10-CM | POA: Diagnosis not present

## 2017-08-07 DIAGNOSIS — F321 Major depressive disorder, single episode, moderate: Secondary | ICD-10-CM | POA: Diagnosis not present

## 2017-08-07 DIAGNOSIS — F4010 Social phobia, unspecified: Secondary | ICD-10-CM | POA: Diagnosis not present

## 2017-08-21 DIAGNOSIS — F4310 Post-traumatic stress disorder, unspecified: Secondary | ICD-10-CM | POA: Diagnosis not present

## 2017-08-21 DIAGNOSIS — F4010 Social phobia, unspecified: Secondary | ICD-10-CM | POA: Diagnosis not present

## 2017-08-21 DIAGNOSIS — F3340 Major depressive disorder, recurrent, in remission, unspecified: Secondary | ICD-10-CM | POA: Diagnosis not present

## 2017-08-22 DIAGNOSIS — F4010 Social phobia, unspecified: Secondary | ICD-10-CM | POA: Diagnosis not present

## 2017-08-22 DIAGNOSIS — F321 Major depressive disorder, single episode, moderate: Secondary | ICD-10-CM | POA: Diagnosis not present

## 2017-09-04 DIAGNOSIS — F321 Major depressive disorder, single episode, moderate: Secondary | ICD-10-CM | POA: Diagnosis not present

## 2017-09-04 DIAGNOSIS — F4010 Social phobia, unspecified: Secondary | ICD-10-CM | POA: Diagnosis not present

## 2017-09-06 ENCOUNTER — Other Ambulatory Visit: Payer: Self-pay | Admitting: Family Medicine

## 2017-09-06 NOTE — Telephone Encounter (Signed)
Pharmacy is requesting 90 day supply of medication for patient- for provider review and approval/denial

## 2017-09-10 DIAGNOSIS — F4010 Social phobia, unspecified: Secondary | ICD-10-CM | POA: Diagnosis not present

## 2017-09-10 DIAGNOSIS — F321 Major depressive disorder, single episode, moderate: Secondary | ICD-10-CM | POA: Diagnosis not present

## 2017-09-12 ENCOUNTER — Telehealth: Payer: Self-pay

## 2017-09-12 DIAGNOSIS — F4010 Social phobia, unspecified: Secondary | ICD-10-CM | POA: Diagnosis not present

## 2017-09-12 DIAGNOSIS — F321 Major depressive disorder, single episode, moderate: Secondary | ICD-10-CM | POA: Diagnosis not present

## 2017-09-12 NOTE — Telephone Encounter (Signed)
Copied from [place] [id number]. Topic: Referral - Request >> Sep 12, 2017 11:48 AM Boyd Kerbs wrote: Reason for CRM:   pt called [facility] has not received the referral.  Can not schedule until get it. Please resend  Spoke with Peter Congo / Referrals - referral sent 07/30/2017 - she will follow up on referral

## 2017-09-18 DIAGNOSIS — F3340 Major depressive disorder, recurrent, in remission, unspecified: Secondary | ICD-10-CM | POA: Diagnosis not present

## 2017-09-18 DIAGNOSIS — F4310 Post-traumatic stress disorder, unspecified: Secondary | ICD-10-CM | POA: Diagnosis not present

## 2017-09-18 DIAGNOSIS — F4010 Social phobia, unspecified: Secondary | ICD-10-CM | POA: Diagnosis not present

## 2017-09-20 DIAGNOSIS — F4010 Social phobia, unspecified: Secondary | ICD-10-CM | POA: Diagnosis not present

## 2017-09-20 DIAGNOSIS — F321 Major depressive disorder, single episode, moderate: Secondary | ICD-10-CM | POA: Diagnosis not present

## 2017-09-27 ENCOUNTER — Telehealth: Payer: Self-pay | Admitting: Family Medicine

## 2017-09-27 DIAGNOSIS — F4010 Social phobia, unspecified: Secondary | ICD-10-CM | POA: Diagnosis not present

## 2017-09-27 DIAGNOSIS — F321 Major depressive disorder, single episode, moderate: Secondary | ICD-10-CM | POA: Diagnosis not present

## 2017-09-27 NOTE — Telephone Encounter (Signed)
[facility] has denied referral based on medical information provided

## 2017-09-30 DIAGNOSIS — F4010 Social phobia, unspecified: Secondary | ICD-10-CM | POA: Diagnosis not present

## 2017-09-30 DIAGNOSIS — F321 Major depressive disorder, single episode, moderate: Secondary | ICD-10-CM | POA: Diagnosis not present

## 2017-10-02 DIAGNOSIS — M25361 Other instability, right knee: Secondary | ICD-10-CM | POA: Diagnosis not present

## 2017-10-02 DIAGNOSIS — S63001A Unspecified subluxation of right wrist and hand, initial encounter: Secondary | ICD-10-CM | POA: Diagnosis not present

## 2017-10-02 DIAGNOSIS — M25571 Pain in right ankle and joints of right foot: Secondary | ICD-10-CM | POA: Diagnosis not present

## 2017-10-02 DIAGNOSIS — S63002A Unspecified subluxation of left wrist and hand, initial encounter: Secondary | ICD-10-CM | POA: Diagnosis not present

## 2017-10-10 DIAGNOSIS — F4010 Social phobia, unspecified: Secondary | ICD-10-CM | POA: Diagnosis not present

## 2017-10-10 DIAGNOSIS — F321 Major depressive disorder, single episode, moderate: Secondary | ICD-10-CM | POA: Diagnosis not present

## 2017-10-11 DIAGNOSIS — M25561 Pain in right knee: Secondary | ICD-10-CM | POA: Diagnosis not present

## 2017-10-11 DIAGNOSIS — M6281 Muscle weakness (generalized): Secondary | ICD-10-CM | POA: Diagnosis not present

## 2017-10-11 DIAGNOSIS — M25531 Pain in right wrist: Secondary | ICD-10-CM | POA: Diagnosis not present

## 2017-10-11 DIAGNOSIS — M25532 Pain in left wrist: Secondary | ICD-10-CM | POA: Diagnosis not present

## 2017-10-23 DIAGNOSIS — M25561 Pain in right knee: Secondary | ICD-10-CM | POA: Diagnosis not present

## 2017-10-23 DIAGNOSIS — M25531 Pain in right wrist: Secondary | ICD-10-CM | POA: Diagnosis not present

## 2017-10-23 DIAGNOSIS — M6281 Muscle weakness (generalized): Secondary | ICD-10-CM | POA: Diagnosis not present

## 2017-10-23 DIAGNOSIS — M25532 Pain in left wrist: Secondary | ICD-10-CM | POA: Diagnosis not present

## 2017-10-28 DIAGNOSIS — F4010 Social phobia, unspecified: Secondary | ICD-10-CM | POA: Diagnosis not present

## 2017-10-28 DIAGNOSIS — F321 Major depressive disorder, single episode, moderate: Secondary | ICD-10-CM | POA: Diagnosis not present

## 2017-10-29 DIAGNOSIS — M25531 Pain in right wrist: Secondary | ICD-10-CM | POA: Diagnosis not present

## 2017-10-29 DIAGNOSIS — M25532 Pain in left wrist: Secondary | ICD-10-CM | POA: Diagnosis not present

## 2017-10-29 DIAGNOSIS — M25561 Pain in right knee: Secondary | ICD-10-CM | POA: Diagnosis not present

## 2017-10-29 DIAGNOSIS — M6281 Muscle weakness (generalized): Secondary | ICD-10-CM | POA: Diagnosis not present

## 2017-11-04 DIAGNOSIS — F321 Major depressive disorder, single episode, moderate: Secondary | ICD-10-CM | POA: Diagnosis not present

## 2017-11-04 DIAGNOSIS — F4010 Social phobia, unspecified: Secondary | ICD-10-CM | POA: Diagnosis not present

## 2017-11-06 ENCOUNTER — Telehealth: Payer: Self-pay | Admitting: Family Medicine

## 2017-11-06 NOTE — Telephone Encounter (Signed)
Copied from [place] [id number]. Topic: Quick Communication - See Telephone Encounter >> Nov 06, 2017 12:45 PM Rutherford Nail, NT wrote: CRM for notification. See Telephone encounter for: 11/06/17. 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 Patient would also like a copy of her medical history/anything that she has been treated for. States that this is for her school. Would like this sent through MyChart is possible.  CB#: [phone]

## 2017-11-08 NOTE — Telephone Encounter (Signed)
Pt need note or written letter stating that she has the diagnosis of Fredderick Phenix Danlos Syndrome   Please see message below

## 2017-11-10 ENCOUNTER — Encounter: Payer: Self-pay | Admitting: Family Medicine

## 2017-11-13 NOTE — Telephone Encounter (Signed)
Sent message to patient requesting details

## 2017-11-14 ENCOUNTER — Other Ambulatory Visit: Payer: Self-pay | Admitting: Family Medicine

## 2017-11-15 DIAGNOSIS — F4010 Social phobia, unspecified: Secondary | ICD-10-CM | POA: Diagnosis not present

## 2017-11-15 DIAGNOSIS — F321 Major depressive disorder, single episode, moderate: Secondary | ICD-10-CM | POA: Diagnosis not present

## 2017-11-15 MED ORDER — CYCLOBENZAPRINE HCL 10 MG PO TABS: 10.0000 mg | ORAL_TABLET | Freq: Two times a day (BID) | ORAL | 0 refills | Status: DC | PRN

## 2017-11-15 NOTE — Telephone Encounter (Signed)
Please advise Pt would like a refill of Flexeril 10 mg. Last OV 07/27/17.

## 2017-11-18 DIAGNOSIS — F321 Major depressive disorder, single episode, moderate: Secondary | ICD-10-CM | POA: Diagnosis not present

## 2017-11-18 DIAGNOSIS — G5601 Carpal tunnel syndrome, right upper limb: Secondary | ICD-10-CM | POA: Diagnosis not present

## 2017-11-18 DIAGNOSIS — F4010 Social phobia, unspecified: Secondary | ICD-10-CM | POA: Diagnosis not present

## 2017-11-19 DIAGNOSIS — M25561 Pain in right knee: Secondary | ICD-10-CM | POA: Diagnosis not present

## 2017-11-19 DIAGNOSIS — M25531 Pain in right wrist: Secondary | ICD-10-CM | POA: Diagnosis not present

## 2017-11-19 DIAGNOSIS — M25562 Pain in left knee: Secondary | ICD-10-CM | POA: Diagnosis not present

## 2017-11-19 DIAGNOSIS — M6281 Muscle weakness (generalized): Secondary | ICD-10-CM | POA: Diagnosis not present

## 2017-11-21 DIAGNOSIS — M2352 Chronic instability of knee, left knee: Secondary | ICD-10-CM | POA: Diagnosis not present

## 2017-11-25 DIAGNOSIS — F4010 Social phobia, unspecified: Secondary | ICD-10-CM | POA: Diagnosis not present

## 2017-11-25 DIAGNOSIS — F321 Major depressive disorder, single episode, moderate: Secondary | ICD-10-CM | POA: Diagnosis not present

## 2017-11-26 DIAGNOSIS — M25531 Pain in right wrist: Secondary | ICD-10-CM | POA: Diagnosis not present

## 2017-12-02 DIAGNOSIS — F321 Major depressive disorder, single episode, moderate: Secondary | ICD-10-CM | POA: Diagnosis not present

## 2017-12-02 DIAGNOSIS — F4010 Social phobia, unspecified: Secondary | ICD-10-CM | POA: Diagnosis not present

## 2017-12-05 DIAGNOSIS — F4010 Social phobia, unspecified: Secondary | ICD-10-CM | POA: Diagnosis not present

## 2017-12-05 DIAGNOSIS — F4310 Post-traumatic stress disorder, unspecified: Secondary | ICD-10-CM | POA: Diagnosis not present

## 2017-12-05 DIAGNOSIS — F3340 Major depressive disorder, recurrent, in remission, unspecified: Secondary | ICD-10-CM | POA: Diagnosis not present

## 2017-12-18 DIAGNOSIS — M25571 Pain in right ankle and joints of right foot: Secondary | ICD-10-CM | POA: Diagnosis not present

## 2017-12-19 DIAGNOSIS — M25571 Pain in right ankle and joints of right foot: Secondary | ICD-10-CM | POA: Diagnosis not present

## 2017-12-20 DIAGNOSIS — F3340 Major depressive disorder, recurrent, in remission, unspecified: Secondary | ICD-10-CM | POA: Diagnosis not present

## 2017-12-20 DIAGNOSIS — F4310 Post-traumatic stress disorder, unspecified: Secondary | ICD-10-CM | POA: Diagnosis not present

## 2017-12-20 DIAGNOSIS — F4010 Social phobia, unspecified: Secondary | ICD-10-CM | POA: Diagnosis not present

## 2018-01-02 DIAGNOSIS — F4310 Post-traumatic stress disorder, unspecified: Secondary | ICD-10-CM | POA: Diagnosis not present

## 2018-01-02 DIAGNOSIS — F4010 Social phobia, unspecified: Secondary | ICD-10-CM | POA: Diagnosis not present

## 2018-01-02 DIAGNOSIS — F3340 Major depressive disorder, recurrent, in remission, unspecified: Secondary | ICD-10-CM | POA: Diagnosis not present

## 2018-01-10 DIAGNOSIS — F4310 Post-traumatic stress disorder, unspecified: Secondary | ICD-10-CM | POA: Diagnosis not present

## 2018-01-10 DIAGNOSIS — F3340 Major depressive disorder, recurrent, in remission, unspecified: Secondary | ICD-10-CM | POA: Diagnosis not present

## 2018-01-10 DIAGNOSIS — F4010 Social phobia, unspecified: Secondary | ICD-10-CM | POA: Diagnosis not present

## 2018-01-11 DIAGNOSIS — S93491A Sprain of other ligament of right ankle, initial encounter: Secondary | ICD-10-CM | POA: Diagnosis not present

## 2018-01-11 DIAGNOSIS — M25571 Pain in right ankle and joints of right foot: Secondary | ICD-10-CM | POA: Diagnosis not present

## 2018-01-17 DIAGNOSIS — F3340 Major depressive disorder, recurrent, in remission, unspecified: Secondary | ICD-10-CM | POA: Diagnosis not present

## 2018-01-17 DIAGNOSIS — F4010 Social phobia, unspecified: Secondary | ICD-10-CM | POA: Diagnosis not present

## 2018-01-17 DIAGNOSIS — F4310 Post-traumatic stress disorder, unspecified: Secondary | ICD-10-CM | POA: Diagnosis not present

## 2018-01-31 DIAGNOSIS — E559 Vitamin D deficiency, unspecified: Secondary | ICD-10-CM | POA: Diagnosis not present

## 2018-01-31 DIAGNOSIS — F3181 Bipolar II disorder: Secondary | ICD-10-CM | POA: Diagnosis not present

## 2018-01-31 DIAGNOSIS — Z79899 Other long term (current) drug therapy: Secondary | ICD-10-CM | POA: Diagnosis not present

## 2018-02-14 DIAGNOSIS — F3340 Major depressive disorder, recurrent, in remission, unspecified: Secondary | ICD-10-CM | POA: Diagnosis not present

## 2018-02-14 DIAGNOSIS — F4010 Social phobia, unspecified: Secondary | ICD-10-CM | POA: Diagnosis not present

## 2018-02-14 DIAGNOSIS — F4310 Post-traumatic stress disorder, unspecified: Secondary | ICD-10-CM | POA: Diagnosis not present

## 2018-02-21 DIAGNOSIS — F4310 Post-traumatic stress disorder, unspecified: Secondary | ICD-10-CM | POA: Diagnosis not present

## 2018-02-21 DIAGNOSIS — F3340 Major depressive disorder, recurrent, in remission, unspecified: Secondary | ICD-10-CM | POA: Diagnosis not present

## 2018-02-21 DIAGNOSIS — F4010 Social phobia, unspecified: Secondary | ICD-10-CM | POA: Diagnosis not present

## 2018-02-28 DIAGNOSIS — F4310 Post-traumatic stress disorder, unspecified: Secondary | ICD-10-CM | POA: Diagnosis not present

## 2018-02-28 DIAGNOSIS — F4010 Social phobia, unspecified: Secondary | ICD-10-CM | POA: Diagnosis not present

## 2018-02-28 DIAGNOSIS — F3340 Major depressive disorder, recurrent, in remission, unspecified: Secondary | ICD-10-CM | POA: Diagnosis not present

## 2018-03-03 ENCOUNTER — Encounter (HOSPITAL_COMMUNITY): Payer: Self-pay | Admitting: Emergency Medicine

## 2018-03-03 ENCOUNTER — Ambulatory Visit: Payer: Self-pay

## 2018-03-03 ENCOUNTER — Ambulatory Visit (HOSPITAL_COMMUNITY)
Admission: EM | Admit: 2018-03-03 | Discharge: 2018-03-03 | Disposition: A | Discharge status: Home / Self Care | Payer: BLUE CROSS/BLUE SHIELD | Attending: Family Medicine | Admitting: Family Medicine

## 2018-03-03 DIAGNOSIS — B9789 Other viral agents as the cause of diseases classified elsewhere: Secondary | ICD-10-CM | POA: Diagnosis not present

## 2018-03-03 DIAGNOSIS — J069 Acute upper respiratory infection, unspecified: Secondary | ICD-10-CM | POA: Diagnosis not present

## 2018-03-03 MED ORDER — IPRATROPIUM BROMIDE 0.06 % NA SOLN: 2.0000 | Freq: Four times a day (QID) | NASAL | 0 refills | Status: DC

## 2018-03-03 MED ORDER — PREDNISONE 50 MG PO TABS: 50.0000 mg | ORAL_TABLET | Freq: Every day | ORAL | 0 refills | Status: DC

## 2018-03-03 MED ORDER — HYDROCODONE-HOMATROPINE 5-1.5 MG/5ML PO SYRP: 5.0000 mL | ORAL_SOLUTION | Freq: Four times a day (QID) | ORAL | 0 refills | Status: DC | PRN

## 2018-03-03 NOTE — ED Provider Notes (Signed)
[facility]    CSN: [id number] Arrival date & time: 03/03/18  1210     History   Chief Complaint Chief Complaint  Patient presents with  . Cough    HPI Elim Peale is a 22 y.o. female.   22 year old female comes in for 3-day history of URI symptoms.  Has had rhinorrhea, nasal congestion, nonproductive cough, throat irritation.  Denies fever, chills, night sweats.  States has been having coughing fits, and has had few episodes of posttussive emesis. Denies shortness of breath, wheezing, chest pain.  Denies abdominal pain, nausea.  OTC cold medication without relief.  Never smoker.  No obvious sick contact.     Past Medical History:  Diagnosis Date  . Ehlers-Danlos disease     Patient Active Problem List   Diagnosis Date Noted  . Anxiety and depression 05/09/2017  . Ehlers-Danlos disease 05/09/2017    Past Surgical History:  Procedure Laterality Date  . KNEE SURGERY      OB History   None      Home Medications    Prior to Admission medications   Medication Sig Start Date End Date Taking? Authorizing Provider  cyclobenzaprine (FLEXERIL) 10 MG tablet Take 1 tablet (10 mg total) by mouth 2 (two) times daily as needed for muscle spasms. 11/15/17   Rutherford Guys, MD  HYDROcodone-homatropine [facility]) 5-1.5 MG/5ML syrup Take 5 mLs by mouth every 6 (six) hours as needed for cough. 03/03/18   Tasia Catchings, Paityn Balsam V, PA-C  hydrOXYzine (ATARAX/VISTARIL) 25 MG tablet Take 1 tablet (25 mg total) by mouth at bedtime as needed. 05/27/17   Rutherford Guys, MD  ipratropium (ATROVENT) 0.06 % nasal spray Place 2 sprays into both nostrils 4 (four) times daily. 03/03/18   Ok Edwards, PA-C  meclizine (ANTIVERT) 12.5 MG tablet Take 1 tablet (12.5 mg total) by mouth 2 (two) times daily as needed for dizziness. 06/03/17   Nche, Charlene Brooke, NP  naproxen (NAPROSYN) 375 MG tablet Take 1 tablet (375 mg total) by mouth 2 (two) times daily between meals as needed for headache. 06/03/17   Nche,  Charlene Brooke, NP  norethindrone-ethinyl estradiol (MICROGESTIN,JUNEL,LOESTRIN) 1-20 MG-MCG tablet Take 1 tablet by mouth daily.    [provider]  predniSONE (DELTASONE) 50 MG tablet Take 1 tablet (50 mg total) by mouth daily. 03/03/18   Tasia Catchings, Mionna Advincula V, PA-C  venlafaxine XR (EFFEXOR-XR) 75 MG 24 hr capsule TAKE 1 CAPSULE (75 MG TOTAL) BY MOUTH DAILY WITH BREAKFAST. 09/19/17   Rutherford Guys, MD    Family History Family History  Problem Relation Age of Onset  . Hypertension Other   . Diabetes Other     Social History Social History   Tobacco Use  . Smoking status: Never Smoker  . Smokeless tobacco: Never Used  Substance Use Topics  . Alcohol use: Yes    Comment: social  . Drug use: No     Allergies   Patient has no known allergies.   Review of Systems Review of Systems  Reason unable to perform ROS: See HPI as above.     Physical Exam Triage Vital Signs ED Triage Vitals [03/03/18 1315]  Enc Vitals Group     BP (!) 143/91     Pulse Rate (!) 102     Resp 18     Temp 98.5 F (36.9 C)     Temp Source Oral     SpO2 98 %     Weight  Height      Head Circumference      Peak Flow      Pain Score 4     Pain Loc      Pain Edu?      Excl. in [place]?    No data found.  Updated Vital Signs BP (!) 143/91 (BP Location: Left Arm)   Pulse (!) 102   Temp 98.5 F (36.9 C) (Oral)   Resp 18   SpO2 98%   Physical Exam  Constitutional: She is oriented to person, place, and time. She appears well-developed and well-nourished. No distress.  HENT:  Head: Normocephalic and atraumatic.  Right Ear: Tympanic membrane, external ear and ear canal normal. Tympanic membrane is not erythematous and not bulging.  Left Ear: Tympanic membrane, external ear and ear canal normal. Tympanic membrane is not erythematous and not bulging.  Nose: Nose normal. Right sinus exhibits no maxillary sinus tenderness and no frontal sinus tenderness. Left sinus exhibits no maxillary sinus  tenderness and no frontal sinus tenderness.  Mouth/Throat: Uvula is midline, oropharynx is clear and moist and mucous membranes are normal.  Eyes: Pupils are equal, round, and reactive to light. Conjunctivae are normal.  Neck: Normal range of motion. Neck supple.  Cardiovascular: Normal rate, regular rhythm and normal heart sounds. Exam reveals no gallop and no friction rub.  No murmur heard. Pulmonary/Chest: Effort normal and breath sounds normal. No accessory muscle usage or stridor. No respiratory distress. She has no decreased breath sounds. She has no wheezes. She has no rhonchi. She has no rales.  Continued cough during exam.   Lymphadenopathy:    She has no cervical adenopathy.  Neurological: She is alert and oriented to person, place, and time.  Skin: Skin is warm and dry.  Psychiatric: She has a normal mood and affect. Her behavior is normal. Judgment normal.     UC Treatments / Results  Labs (all labs ordered are listed, but only abnormal results are displayed) Labs Reviewed - No data to display  EKG None  Radiology No results found.  Procedures Procedures (including critical care time)  Medications Ordered in UC Medications - No data to display  Initial Impression / Assessment and Plan / UC Course  I have reviewed the triage vital signs and the nursing notes.  Pertinent labs & imaging results that were available during my care of the patient were reviewed by me and considered in my medical decision making (see chart for details).    Discussed with patient history and exam most consistent with viral URI. Symptomatic treatment as needed. Push fluids. Return precautions given.   Final Clinical Impressions(s) / UC Diagnoses   Final diagnoses:  Viral URI with cough    ED Prescriptions    Medication Sig Dispense Auth. Provider   predniSONE (DELTASONE) 50 MG tablet Take 1 tablet (50 mg total) by mouth daily. 5 tablet Leone Putman V, PA-C   ipratropium (ATROVENT) 0.06  % nasal spray Place 2 sprays into both nostrils 4 (four) times daily. 15 mL Ary Rudnick V, PA-C   HYDROcodone-homatropine (HYCODAN) 5-1.5 MG/5ML syrup Take 5 mLs by mouth every 6 (six) hours as needed for cough. 120 mL Cathlean Sauer V, PA-C     Controlled Substance Prescriptions [place] Controlled Substance Registry consulted? Yes, I have consulted the [place] Controlled Substances Registry for this patient, and feel the risk/benefit ratio today is favorable for proceeding with this prescription for a controlled substance.   Ok Edwards, PA-C 03/03/18 1349

## 2018-03-03 NOTE — Discharge Instructions (Signed)
Prednisone for cough. Hycodan as needed for cough at night. Start atrovent nasal spray for nasal congestion/drainage. You can use over the counter nasal saline rinse such as neti pot for nasal congestion. Keep hydrated, your urine should be clear to pale yellow in color. Tylenol/motrin for fever and pain. Monitor for any worsening of symptoms, chest pain, shortness of breath, wheezing, swelling of the throat, follow up for reevaluation.   For sore throat/cough try using a honey-based tea. Use 3 teaspoons of honey with juice squeezed from half lemon. Place shaved pieces of ginger into 1/2-1 cup of water and warm over stove top. Then mix the ingredients and repeat every 4 hours as needed.

## 2018-03-03 NOTE — ED Triage Notes (Signed)
Pt here for cough x 3 days with URI sx

## 2018-03-03 NOTE — Telephone Encounter (Signed)
Pt. C/o severe cough - no appointments availability at [facility]. Pt. Has gone to UC.

## 2018-03-07 DIAGNOSIS — F3340 Major depressive disorder, recurrent, in remission, unspecified: Secondary | ICD-10-CM | POA: Diagnosis not present

## 2018-03-07 DIAGNOSIS — F4010 Social phobia, unspecified: Secondary | ICD-10-CM | POA: Diagnosis not present

## 2018-03-07 DIAGNOSIS — F4310 Post-traumatic stress disorder, unspecified: Secondary | ICD-10-CM | POA: Diagnosis not present

## 2018-03-20 DIAGNOSIS — F4010 Social phobia, unspecified: Secondary | ICD-10-CM | POA: Diagnosis not present

## 2018-03-20 DIAGNOSIS — F4310 Post-traumatic stress disorder, unspecified: Secondary | ICD-10-CM | POA: Diagnosis not present

## 2018-03-20 DIAGNOSIS — F3340 Major depressive disorder, recurrent, in remission, unspecified: Secondary | ICD-10-CM | POA: Diagnosis not present

## 2018-03-21 DIAGNOSIS — F3340 Major depressive disorder, recurrent, in remission, unspecified: Secondary | ICD-10-CM | POA: Diagnosis not present

## 2018-03-21 DIAGNOSIS — F4310 Post-traumatic stress disorder, unspecified: Secondary | ICD-10-CM | POA: Diagnosis not present

## 2018-03-21 DIAGNOSIS — F4010 Social phobia, unspecified: Secondary | ICD-10-CM | POA: Diagnosis not present

## 2018-03-27 DIAGNOSIS — F4010 Social phobia, unspecified: Secondary | ICD-10-CM | POA: Diagnosis not present

## 2018-03-27 DIAGNOSIS — F4310 Post-traumatic stress disorder, unspecified: Secondary | ICD-10-CM | POA: Diagnosis not present

## 2018-03-27 DIAGNOSIS — F3340 Major depressive disorder, recurrent, in remission, unspecified: Secondary | ICD-10-CM | POA: Diagnosis not present

## 2018-04-04 DIAGNOSIS — F3340 Major depressive disorder, recurrent, in remission, unspecified: Secondary | ICD-10-CM | POA: Diagnosis not present

## 2018-04-04 DIAGNOSIS — F4010 Social phobia, unspecified: Secondary | ICD-10-CM | POA: Diagnosis not present

## 2018-04-04 DIAGNOSIS — F4310 Post-traumatic stress disorder, unspecified: Secondary | ICD-10-CM | POA: Diagnosis not present

## 2018-04-19 DIAGNOSIS — N63 Unspecified lump in unspecified breast: Secondary | ICD-10-CM | POA: Diagnosis not present

## 2018-04-19 DIAGNOSIS — R0781 Pleurodynia: Secondary | ICD-10-CM | POA: Diagnosis not present

## 2018-04-19 DIAGNOSIS — M940 Chondrocostal junction syndrome [Tietze]: Secondary | ICD-10-CM | POA: Diagnosis not present

## 2018-04-30 DIAGNOSIS — F4010 Social phobia, unspecified: Secondary | ICD-10-CM | POA: Diagnosis not present

## 2018-04-30 DIAGNOSIS — F3340 Major depressive disorder, recurrent, in remission, unspecified: Secondary | ICD-10-CM | POA: Diagnosis not present

## 2018-04-30 DIAGNOSIS — F4310 Post-traumatic stress disorder, unspecified: Secondary | ICD-10-CM | POA: Diagnosis not present

## 2018-05-07 DIAGNOSIS — F4010 Social phobia, unspecified: Secondary | ICD-10-CM | POA: Diagnosis not present

## 2018-05-07 DIAGNOSIS — F3340 Major depressive disorder, recurrent, in remission, unspecified: Secondary | ICD-10-CM | POA: Diagnosis not present

## 2018-05-07 DIAGNOSIS — F4310 Post-traumatic stress disorder, unspecified: Secondary | ICD-10-CM | POA: Diagnosis not present

## 2018-05-14 DIAGNOSIS — F4310 Post-traumatic stress disorder, unspecified: Secondary | ICD-10-CM | POA: Diagnosis not present

## 2018-05-14 DIAGNOSIS — F4010 Social phobia, unspecified: Secondary | ICD-10-CM | POA: Diagnosis not present

## 2018-05-14 DIAGNOSIS — F3340 Major depressive disorder, recurrent, in remission, unspecified: Secondary | ICD-10-CM | POA: Diagnosis not present

## 2018-05-21 DIAGNOSIS — F3340 Major depressive disorder, recurrent, in remission, unspecified: Secondary | ICD-10-CM | POA: Diagnosis not present

## 2018-05-21 DIAGNOSIS — F4310 Post-traumatic stress disorder, unspecified: Secondary | ICD-10-CM | POA: Diagnosis not present

## 2018-05-21 DIAGNOSIS — F4010 Social phobia, unspecified: Secondary | ICD-10-CM | POA: Diagnosis not present

## 2018-05-28 DIAGNOSIS — F3340 Major depressive disorder, recurrent, in remission, unspecified: Secondary | ICD-10-CM | POA: Diagnosis not present

## 2018-05-28 DIAGNOSIS — F4010 Social phobia, unspecified: Secondary | ICD-10-CM | POA: Diagnosis not present

## 2018-05-28 DIAGNOSIS — F4310 Post-traumatic stress disorder, unspecified: Secondary | ICD-10-CM | POA: Diagnosis not present

## 2018-06-11 DIAGNOSIS — F4010 Social phobia, unspecified: Secondary | ICD-10-CM | POA: Diagnosis not present

## 2018-06-11 DIAGNOSIS — F4310 Post-traumatic stress disorder, unspecified: Secondary | ICD-10-CM | POA: Diagnosis not present

## 2018-06-11 DIAGNOSIS — F3340 Major depressive disorder, recurrent, in remission, unspecified: Secondary | ICD-10-CM | POA: Diagnosis not present

## 2018-06-23 DIAGNOSIS — F4010 Social phobia, unspecified: Secondary | ICD-10-CM | POA: Diagnosis not present

## 2018-06-23 DIAGNOSIS — F4310 Post-traumatic stress disorder, unspecified: Secondary | ICD-10-CM | POA: Diagnosis not present

## 2018-06-23 DIAGNOSIS — F3340 Major depressive disorder, recurrent, in remission, unspecified: Secondary | ICD-10-CM | POA: Diagnosis not present

## 2018-06-25 DIAGNOSIS — F4010 Social phobia, unspecified: Secondary | ICD-10-CM | POA: Diagnosis not present

## 2018-06-25 DIAGNOSIS — F3340 Major depressive disorder, recurrent, in remission, unspecified: Secondary | ICD-10-CM | POA: Diagnosis not present

## 2018-06-25 DIAGNOSIS — F4310 Post-traumatic stress disorder, unspecified: Secondary | ICD-10-CM | POA: Diagnosis not present

## 2018-07-02 DIAGNOSIS — F4010 Social phobia, unspecified: Secondary | ICD-10-CM | POA: Diagnosis not present

## 2018-07-02 DIAGNOSIS — F4310 Post-traumatic stress disorder, unspecified: Secondary | ICD-10-CM | POA: Diagnosis not present

## 2018-07-02 DIAGNOSIS — F3340 Major depressive disorder, recurrent, in remission, unspecified: Secondary | ICD-10-CM | POA: Diagnosis not present

## 2018-07-09 DIAGNOSIS — F3340 Major depressive disorder, recurrent, in remission, unspecified: Secondary | ICD-10-CM | POA: Diagnosis not present

## 2018-07-09 DIAGNOSIS — F4010 Social phobia, unspecified: Secondary | ICD-10-CM | POA: Diagnosis not present

## 2018-07-09 DIAGNOSIS — F4310 Post-traumatic stress disorder, unspecified: Secondary | ICD-10-CM | POA: Diagnosis not present

## 2018-07-17 DIAGNOSIS — F3340 Major depressive disorder, recurrent, in remission, unspecified: Secondary | ICD-10-CM | POA: Diagnosis not present

## 2018-07-17 DIAGNOSIS — F4310 Post-traumatic stress disorder, unspecified: Secondary | ICD-10-CM | POA: Diagnosis not present

## 2018-07-17 DIAGNOSIS — F4010 Social phobia, unspecified: Secondary | ICD-10-CM | POA: Diagnosis not present

## 2018-07-23 DIAGNOSIS — F3340 Major depressive disorder, recurrent, in remission, unspecified: Secondary | ICD-10-CM | POA: Diagnosis not present

## 2018-07-23 DIAGNOSIS — F4310 Post-traumatic stress disorder, unspecified: Secondary | ICD-10-CM | POA: Diagnosis not present

## 2018-07-23 DIAGNOSIS — F4010 Social phobia, unspecified: Secondary | ICD-10-CM | POA: Diagnosis not present

## 2018-07-30 DIAGNOSIS — F4310 Post-traumatic stress disorder, unspecified: Secondary | ICD-10-CM | POA: Diagnosis not present

## 2018-07-30 DIAGNOSIS — F3340 Major depressive disorder, recurrent, in remission, unspecified: Secondary | ICD-10-CM | POA: Diagnosis not present

## 2018-07-30 DIAGNOSIS — F4010 Social phobia, unspecified: Secondary | ICD-10-CM | POA: Diagnosis not present

## 2018-08-05 DIAGNOSIS — F4310 Post-traumatic stress disorder, unspecified: Secondary | ICD-10-CM | POA: Diagnosis not present

## 2018-08-05 DIAGNOSIS — F3340 Major depressive disorder, recurrent, in remission, unspecified: Secondary | ICD-10-CM | POA: Diagnosis not present

## 2018-08-05 DIAGNOSIS — F4010 Social phobia, unspecified: Secondary | ICD-10-CM | POA: Diagnosis not present

## 2018-11-03 DIAGNOSIS — F4010 Social phobia, unspecified: Secondary | ICD-10-CM | POA: Diagnosis not present

## 2018-11-03 DIAGNOSIS — F4310 Post-traumatic stress disorder, unspecified: Secondary | ICD-10-CM | POA: Diagnosis not present

## 2018-11-03 DIAGNOSIS — F3340 Major depressive disorder, recurrent, in remission, unspecified: Secondary | ICD-10-CM | POA: Diagnosis not present

## 2018-11-06 DIAGNOSIS — F3340 Major depressive disorder, recurrent, in remission, unspecified: Secondary | ICD-10-CM | POA: Diagnosis not present

## 2018-11-06 DIAGNOSIS — F4010 Social phobia, unspecified: Secondary | ICD-10-CM | POA: Diagnosis not present

## 2018-11-06 DIAGNOSIS — F4310 Post-traumatic stress disorder, unspecified: Secondary | ICD-10-CM | POA: Diagnosis not present

## 2018-12-01 DIAGNOSIS — R0781 Pleurodynia: Secondary | ICD-10-CM | POA: Diagnosis not present

## 2018-12-08 DIAGNOSIS — F4010 Social phobia, unspecified: Secondary | ICD-10-CM | POA: Diagnosis not present

## 2018-12-08 DIAGNOSIS — F4310 Post-traumatic stress disorder, unspecified: Secondary | ICD-10-CM | POA: Diagnosis not present

## 2018-12-08 DIAGNOSIS — F3340 Major depressive disorder, recurrent, in remission, unspecified: Secondary | ICD-10-CM | POA: Diagnosis not present

## 2018-12-15 DIAGNOSIS — F4010 Social phobia, unspecified: Secondary | ICD-10-CM | POA: Diagnosis not present

## 2018-12-15 DIAGNOSIS — F3340 Major depressive disorder, recurrent, in remission, unspecified: Secondary | ICD-10-CM | POA: Diagnosis not present

## 2018-12-15 DIAGNOSIS — F4310 Post-traumatic stress disorder, unspecified: Secondary | ICD-10-CM | POA: Diagnosis not present

## 2018-12-24 DIAGNOSIS — F4310 Post-traumatic stress disorder, unspecified: Secondary | ICD-10-CM | POA: Diagnosis not present

## 2018-12-24 DIAGNOSIS — F4010 Social phobia, unspecified: Secondary | ICD-10-CM | POA: Diagnosis not present

## 2018-12-24 DIAGNOSIS — F3340 Major depressive disorder, recurrent, in remission, unspecified: Secondary | ICD-10-CM | POA: Diagnosis not present

## 2018-12-29 DIAGNOSIS — F4310 Post-traumatic stress disorder, unspecified: Secondary | ICD-10-CM | POA: Diagnosis not present

## 2018-12-29 DIAGNOSIS — F4010 Social phobia, unspecified: Secondary | ICD-10-CM | POA: Diagnosis not present

## 2018-12-29 DIAGNOSIS — F3340 Major depressive disorder, recurrent, in remission, unspecified: Secondary | ICD-10-CM | POA: Diagnosis not present

## 2019-01-05 DIAGNOSIS — F3340 Major depressive disorder, recurrent, in remission, unspecified: Secondary | ICD-10-CM | POA: Diagnosis not present

## 2019-01-05 DIAGNOSIS — F4310 Post-traumatic stress disorder, unspecified: Secondary | ICD-10-CM | POA: Diagnosis not present

## 2019-01-05 DIAGNOSIS — F4010 Social phobia, unspecified: Secondary | ICD-10-CM | POA: Diagnosis not present

## 2019-01-12 DIAGNOSIS — F4010 Social phobia, unspecified: Secondary | ICD-10-CM | POA: Diagnosis not present

## 2019-01-12 DIAGNOSIS — F4310 Post-traumatic stress disorder, unspecified: Secondary | ICD-10-CM | POA: Diagnosis not present

## 2019-01-12 DIAGNOSIS — F3340 Major depressive disorder, recurrent, in remission, unspecified: Secondary | ICD-10-CM | POA: Diagnosis not present

## 2019-01-19 DIAGNOSIS — F4310 Post-traumatic stress disorder, unspecified: Secondary | ICD-10-CM | POA: Diagnosis not present

## 2019-01-19 DIAGNOSIS — F4010 Social phobia, unspecified: Secondary | ICD-10-CM | POA: Diagnosis not present

## 2019-01-19 DIAGNOSIS — F3340 Major depressive disorder, recurrent, in remission, unspecified: Secondary | ICD-10-CM | POA: Diagnosis not present

## 2019-01-28 DIAGNOSIS — F4310 Post-traumatic stress disorder, unspecified: Secondary | ICD-10-CM | POA: Diagnosis not present

## 2019-01-28 DIAGNOSIS — F4010 Social phobia, unspecified: Secondary | ICD-10-CM | POA: Diagnosis not present

## 2019-01-28 DIAGNOSIS — F3340 Major depressive disorder, recurrent, in remission, unspecified: Secondary | ICD-10-CM | POA: Diagnosis not present

## 2019-01-30 DIAGNOSIS — F3181 Bipolar II disorder: Secondary | ICD-10-CM | POA: Diagnosis not present

## 2019-01-30 DIAGNOSIS — Z79899 Other long term (current) drug therapy: Secondary | ICD-10-CM | POA: Diagnosis not present

## 2019-02-02 DIAGNOSIS — F3340 Major depressive disorder, recurrent, in remission, unspecified: Secondary | ICD-10-CM | POA: Diagnosis not present

## 2019-02-02 DIAGNOSIS — F4010 Social phobia, unspecified: Secondary | ICD-10-CM | POA: Diagnosis not present

## 2019-02-02 DIAGNOSIS — F4310 Post-traumatic stress disorder, unspecified: Secondary | ICD-10-CM | POA: Diagnosis not present

## 2019-02-09 DIAGNOSIS — F3340 Major depressive disorder, recurrent, in remission, unspecified: Secondary | ICD-10-CM | POA: Diagnosis not present

## 2019-02-09 DIAGNOSIS — F4010 Social phobia, unspecified: Secondary | ICD-10-CM | POA: Diagnosis not present

## 2019-02-09 DIAGNOSIS — F4310 Post-traumatic stress disorder, unspecified: Secondary | ICD-10-CM | POA: Diagnosis not present

## 2019-02-23 DIAGNOSIS — F3340 Major depressive disorder, recurrent, in remission, unspecified: Secondary | ICD-10-CM | POA: Diagnosis not present

## 2019-02-23 DIAGNOSIS — F4010 Social phobia, unspecified: Secondary | ICD-10-CM | POA: Diagnosis not present

## 2019-02-23 DIAGNOSIS — F4310 Post-traumatic stress disorder, unspecified: Secondary | ICD-10-CM | POA: Diagnosis not present

## 2019-03-09 DIAGNOSIS — F4310 Post-traumatic stress disorder, unspecified: Secondary | ICD-10-CM | POA: Diagnosis not present

## 2019-03-09 DIAGNOSIS — F3340 Major depressive disorder, recurrent, in remission, unspecified: Secondary | ICD-10-CM | POA: Diagnosis not present

## 2019-03-09 DIAGNOSIS — F4010 Social phobia, unspecified: Secondary | ICD-10-CM | POA: Diagnosis not present

## 2019-03-24 DIAGNOSIS — F4010 Social phobia, unspecified: Secondary | ICD-10-CM | POA: Diagnosis not present

## 2019-03-24 DIAGNOSIS — F4310 Post-traumatic stress disorder, unspecified: Secondary | ICD-10-CM | POA: Diagnosis not present

## 2019-03-24 DIAGNOSIS — F3340 Major depressive disorder, recurrent, in remission, unspecified: Secondary | ICD-10-CM | POA: Diagnosis not present

## 2019-03-30 DIAGNOSIS — F4310 Post-traumatic stress disorder, unspecified: Secondary | ICD-10-CM | POA: Diagnosis not present

## 2019-03-30 DIAGNOSIS — F3340 Major depressive disorder, recurrent, in remission, unspecified: Secondary | ICD-10-CM | POA: Diagnosis not present

## 2019-03-30 DIAGNOSIS — F4010 Social phobia, unspecified: Secondary | ICD-10-CM | POA: Diagnosis not present

## 2019-04-06 DIAGNOSIS — F4010 Social phobia, unspecified: Secondary | ICD-10-CM | POA: Diagnosis not present

## 2019-04-06 DIAGNOSIS — F3340 Major depressive disorder, recurrent, in remission, unspecified: Secondary | ICD-10-CM | POA: Diagnosis not present

## 2019-04-06 DIAGNOSIS — F4310 Post-traumatic stress disorder, unspecified: Secondary | ICD-10-CM | POA: Diagnosis not present

## 2019-04-20 DIAGNOSIS — F3340 Major depressive disorder, recurrent, in remission, unspecified: Secondary | ICD-10-CM | POA: Diagnosis not present

## 2019-04-20 DIAGNOSIS — F4310 Post-traumatic stress disorder, unspecified: Secondary | ICD-10-CM | POA: Diagnosis not present

## 2019-04-20 DIAGNOSIS — F4010 Social phobia, unspecified: Secondary | ICD-10-CM | POA: Diagnosis not present

## 2019-05-04 DIAGNOSIS — F3340 Major depressive disorder, recurrent, in remission, unspecified: Secondary | ICD-10-CM | POA: Diagnosis not present

## 2019-05-04 DIAGNOSIS — F4010 Social phobia, unspecified: Secondary | ICD-10-CM | POA: Diagnosis not present

## 2019-05-04 DIAGNOSIS — F4310 Post-traumatic stress disorder, unspecified: Secondary | ICD-10-CM | POA: Diagnosis not present

## 2019-05-18 ENCOUNTER — Ambulatory Visit: Payer: BC Managed Care – PPO | Attending: Internal Medicine

## 2019-05-18 DIAGNOSIS — Z20822 Contact with and (suspected) exposure to covid-19: Secondary | ICD-10-CM

## 2019-05-19 LAB — NOVEL CORONAVIRUS, NAA: SARS-CoV-2, NAA: NOT DETECTED

## 2019-05-22 DIAGNOSIS — J988 Other specified respiratory disorders: Secondary | ICD-10-CM | POA: Diagnosis not present

## 2019-06-25 DIAGNOSIS — F4310 Post-traumatic stress disorder, unspecified: Secondary | ICD-10-CM | POA: Diagnosis not present

## 2019-06-25 DIAGNOSIS — F4010 Social phobia, unspecified: Secondary | ICD-10-CM | POA: Diagnosis not present

## 2019-06-25 DIAGNOSIS — F3340 Major depressive disorder, recurrent, in remission, unspecified: Secondary | ICD-10-CM | POA: Diagnosis not present

## 2019-09-11 ENCOUNTER — Encounter (HOSPITAL_COMMUNITY): Payer: Self-pay | Admitting: Emergency Medicine

## 2019-09-11 ENCOUNTER — Other Ambulatory Visit: Payer: Self-pay

## 2019-09-11 ENCOUNTER — Observation Stay (HOSPITAL_COMMUNITY)
Admission: EM | Admit: 2019-09-11 | Discharge: 2019-09-13 | Disposition: A | Discharge status: Home / Self Care | Payer: BC Managed Care – PPO | Attending: General Surgery | Admitting: General Surgery

## 2019-09-11 DIAGNOSIS — Z20822 Contact with and (suspected) exposure to covid-19: Secondary | ICD-10-CM | POA: Insufficient documentation

## 2019-09-11 DIAGNOSIS — K3580 Unspecified acute appendicitis: Secondary | ICD-10-CM | POA: Diagnosis not present

## 2019-09-11 DIAGNOSIS — F419 Anxiety disorder, unspecified: Secondary | ICD-10-CM | POA: Diagnosis not present

## 2019-09-11 DIAGNOSIS — Z793 Long term (current) use of hormonal contraceptives: Secondary | ICD-10-CM | POA: Insufficient documentation

## 2019-09-11 DIAGNOSIS — K37 Unspecified appendicitis: Secondary | ICD-10-CM | POA: Diagnosis present

## 2019-09-11 DIAGNOSIS — Q796 Ehlers-Danlos syndrome, unspecified: Secondary | ICD-10-CM | POA: Diagnosis not present

## 2019-09-11 DIAGNOSIS — Z7952 Long term (current) use of systemic steroids: Secondary | ICD-10-CM | POA: Insufficient documentation

## 2019-09-11 DIAGNOSIS — Z79899 Other long term (current) drug therapy: Secondary | ICD-10-CM | POA: Insufficient documentation

## 2019-09-11 DIAGNOSIS — D270 Benign neoplasm of right ovary: Secondary | ICD-10-CM | POA: Diagnosis not present

## 2019-09-11 DIAGNOSIS — R1031 Right lower quadrant pain: Secondary | ICD-10-CM | POA: Diagnosis not present

## 2019-09-11 DIAGNOSIS — D271 Benign neoplasm of left ovary: Secondary | ICD-10-CM

## 2019-09-11 DIAGNOSIS — R531 Weakness: Secondary | ICD-10-CM | POA: Diagnosis not present

## 2019-09-11 DIAGNOSIS — F329 Major depressive disorder, single episode, unspecified: Secondary | ICD-10-CM | POA: Insufficient documentation

## 2019-09-11 DIAGNOSIS — Z791 Long term (current) use of non-steroidal anti-inflammatories (NSAID): Secondary | ICD-10-CM | POA: Diagnosis not present

## 2019-09-11 LAB — COMPREHENSIVE METABOLIC PANEL
ALT: 21 U/L (ref 0–44)
AST: 16 U/L (ref 15–41)
Albumin: 4.4 g/dL (ref 3.5–5.0)
Alkaline Phosphatase: 108 U/L (ref 38–126)
Anion gap: 11 (ref 5–15)
BUN: 12 mg/dL (ref 6–20)
CO2: 26 mmol/L (ref 22–32)
Calcium: 9.4 mg/dL (ref 8.9–10.3)
Chloride: 101 mmol/L (ref 98–111)
Creatinine, Ser: 0.79 mg/dL (ref 0.44–1.00)
GFR calc Af Amer: >60 mL/min (ref >60)
GFR calc non Af Amer: >60 mL/min (ref >60)
Glucose, Bld: 111 mg/dL — ABNORMAL HIGH (ref 70–99)
Potassium: 4.3 mmol/L (ref 3.5–5.1)
Sodium: 138 mmol/L (ref 135–145)
Total Bilirubin: 0.5 mg/dL (ref 0.3–1.2)
Total Protein: 8 g/dL (ref 6.5–8.1)

## 2019-09-11 LAB — URINALYSIS, ROUTINE W REFLEX MICROSCOPIC
Bilirubin Urine: NEGATIVE
Glucose, UA: NEGATIVE mg/dL
Hgb urine dipstick: NEGATIVE
Ketones, ur: NEGATIVE mg/dL
Leukocytes,Ua: NEGATIVE
Nitrite: NEGATIVE
Protein, ur: NEGATIVE mg/dL
Specific Gravity, Urine: 1.024 (ref 1.005–1.030)
pH: 5 (ref 5.0–8.0)

## 2019-09-11 LAB — CBC
HCT: 41.8 % (ref 36.0–46.0)
Hemoglobin: 13.6 g/dL (ref 12.0–15.0)
MCH: 28.2 pg (ref 26.0–34.0)
MCHC: 32.5 g/dL (ref 30.0–36.0)
MCV: 86.7 fL (ref 80.0–100.0)
Platelets: 298 10*3/uL (ref 150–400)
RBC: 4.82 MIL/uL (ref 3.87–5.11)
RDW: 12.7 % (ref 11.5–15.5)
WBC: 18 10*3/uL — ABNORMAL HIGH (ref 4.0–10.5)
nRBC: 0 % (ref 0.0–0.2)

## 2019-09-11 LAB — I-STAT BETA HCG BLOOD, ED (MC, WL, AP ONLY): I-stat hCG, quantitative: <5 m[IU]/mL (ref <5)

## 2019-09-11 LAB — LIPASE, BLOOD: Lipase: 21 U/L (ref 11–51)

## 2019-09-11 MED ORDER — ONDANSETRON HCL 4 MG/2ML IJ SOLN
4.0000 mg | Freq: Once | INTRAMUSCULAR | Status: AC
  Administered 2019-09-12: 4 mg via INTRAVENOUS
  Filled 2019-09-11: qty 2

## 2019-09-11 MED ORDER — SODIUM CHLORIDE 0.9 % IV BOLUS
1000.0000 mL | Freq: Once | INTRAVENOUS | Status: AC
  Administered 2019-09-12: 1000 mL via INTRAVENOUS

## 2019-09-11 MED ORDER — SODIUM CHLORIDE 0.9 % IV SOLN
2.0000 g | Freq: Once | INTRAVENOUS | Status: AC
  Administered 2019-09-12: 2 g via INTRAVENOUS
  Filled 2019-09-11: qty 20

## 2019-09-11 MED ORDER — METRONIDAZOLE IN NACL 5-0.79 MG/ML-% IV SOLN
500.0000 mg | Freq: Once | INTRAVENOUS | Status: AC
  Administered 2019-09-12: 500 mg via INTRAVENOUS
  Filled 2019-09-11: qty 100

## 2019-09-11 MED ORDER — MORPHINE SULFATE (PF) 4 MG/ML IV SOLN
4.0000 mg | Freq: Once | INTRAVENOUS | Status: AC
  Administered 2019-09-12: 4 mg via INTRAVENOUS
  Filled 2019-09-11: qty 1

## 2019-09-11 NOTE — ED Provider Notes (Signed)
[place] DEPT Provider Note   CSN: [id number] Arrival date & time: 09/11/19  2157     History Chief Complaint  Patient presents with  . Abdominal Pain    Linda Shea is a 24 y.o. female with history of Ehlers-Danlos disease who presents to the emergency department with a chief complaint of abdominal pain.  The patient reports that she developed upper abdominal pain this evening.  She reports that the abdominal pain has now moved lower around her umbilicus and in the right lower quadrant.  Pain is constant and worsening since onset.  Pain is worse with positional changes.   She has had 3 episodes of nonbloody, nonbilious vomiting.  She has had a poor appetite for most of the day.  Last PO intake was soup ~19:00.  No fever, chills, diarrhea, constipation, dysuria, hematuria, flank pain, vaginal bleeding, pain, or discharge.    No history of abdominal surgery.  She attempted to take meclizine and Tums prior to arrival with minimal, short-lived improvement in her symptoms.  She does not take any daily medications.   The history is provided by the patient. No language interpreter was used.       Past Medical History:  Diagnosis Date  . Ehlers-Danlos disease     Patient Active Problem List   Diagnosis Date Noted  . Appendicitis 09/12/2019  . Anxiety and depression 05/09/2017  . Ehlers-Danlos disease 05/09/2017    Past Surgical History:  Procedure Laterality Date  . KNEE SURGERY       OB History   No obstetric history on file.     Family History  Problem Relation Age of Onset  . Hypertension Other   . Diabetes Other     Social History   Tobacco Use  . Smoking status: Never Smoker  . Smokeless tobacco: Never Used  Substance Use Topics  . Alcohol use: Yes    Comment: social  . Drug use: No    Home Medications Prior to Admission medications   Medication Sig Start Date End Date Taking? Authorizing Provider  cyclobenzaprine  (FLEXERIL) 10 MG tablet Take 1 tablet (10 mg total) by mouth 2 (two) times daily as needed for muscle spasms. 11/15/17   Rutherford Guys, MD  HYDROcodone-homatropine [facility]) 5-1.5 MG/5ML syrup Take 5 mLs by mouth every 6 (six) hours as needed for cough. 03/03/18   Tasia Catchings, Amy V, PA-C  hydrOXYzine (ATARAX/VISTARIL) 25 MG tablet Take 1 tablet (25 mg total) by mouth at bedtime as needed. 05/27/17   Rutherford Guys, MD  ipratropium (ATROVENT) 0.06 % nasal spray Place 2 sprays into both nostrils 4 (four) times daily. 03/03/18   Ok Edwards, PA-C  meclizine (ANTIVERT) 12.5 MG tablet Take 1 tablet (12.5 mg total) by mouth 2 (two) times daily as needed for dizziness. 06/03/17   Nche, Charlene Brooke, NP  naproxen (NAPROSYN) 375 MG tablet Take 1 tablet (375 mg total) by mouth 2 (two) times daily between meals as needed for headache. 06/03/17   Nche, Charlene Brooke, NP  norethindrone-ethinyl estradiol (MICROGESTIN,JUNEL,LOESTRIN) 1-20 MG-MCG tablet Take 1 tablet by mouth daily.    [provider]  predniSONE (DELTASONE) 50 MG tablet Take 1 tablet (50 mg total) by mouth daily. 03/03/18   Tasia Catchings, Amy V, PA-C  venlafaxine XR (EFFEXOR-XR) 75 MG 24 hr capsule TAKE 1 CAPSULE (75 MG TOTAL) BY MOUTH DAILY WITH BREAKFAST. 09/19/17   Rutherford Guys, MD    Allergies    Patient has no  known allergies.  Review of Systems   Review of Systems  Constitutional: Negative for activity change, chills and fever.  Respiratory: Negative for shortness of breath and wheezing.   Cardiovascular: Negative for chest pain, palpitations and leg swelling.  Gastrointestinal: Positive for abdominal pain, nausea and vomiting. Negative for blood in stool, constipation and diarrhea.  Genitourinary: Negative for dysuria, flank pain, genital sores, hematuria, urgency, vaginal bleeding, vaginal discharge and vaginal pain.  Musculoskeletal: Negative for back pain, myalgias, neck pain and neck stiffness.  Skin: Negative for rash.    Allergic/Immunologic: Negative for immunocompromised state.  Neurological: Negative for seizures, syncope, weakness, numbness and headaches.  Psychiatric/Behavioral: Negative for confusion.    Physical Exam Updated Vital Signs BP 123/79 (BP Location: Left Arm)   Pulse 79   Temp 98.4 F (36.9 C) (Oral)   Resp 18   Ht 5\' 11"  (1.803 m)   Wt 90.7 kg   SpO2 94%   BMI 27.89 kg/m   Physical Exam Vitals and nursing note reviewed.  Constitutional:      General: She is not in acute distress.    Appearance: She is not ill-appearing, toxic-appearing or diaphoretic.  HENT:     Head: Normocephalic.  Eyes:     Conjunctiva/sclera: Conjunctivae normal.  Cardiovascular:     Rate and Rhythm: Normal rate and regular rhythm.     Heart sounds: No murmur. No friction rub. No gallop.   Pulmonary:     Effort: Pulmonary effort is normal. No respiratory distress.  Abdominal:     General: There is no distension.     Palpations: Abdomen is soft. There is no mass.     Tenderness: There is abdominal tenderness. There is guarding. There is no right CVA tenderness, left CVA tenderness or rebound.     Hernia: No hernia is present.     Comments: Tender to palpation in the right lower quadrant.  She is tender over McBurney's point.  Positive Rovsing sign.  There is voluntary guarding, but no rebound.  Negative Murphy sign.  No CVA tenderness bilaterally.  Normoactive bowel sounds.  Remainder of the abdominal exam is unremarkable.  Musculoskeletal:     Cervical back: Neck supple.     Right lower leg: No edema.     Left lower leg: No edema.  Skin:    General: Skin is warm.     Coloration: Skin is pale. Skin is not jaundiced.     Findings: No erythema or rash.  Neurological:     Mental Status: She is alert.  Psychiatric:        Behavior: Behavior normal.     ED Results / Procedures / Treatments   Labs (all labs ordered are listed, but only abnormal results are displayed) Labs Reviewed   COMPREHENSIVE METABOLIC PANEL - Abnormal; Notable for the following components:      Result Value   Glucose, Bld 111 (*)    All other components within normal limits  CBC - Abnormal; Notable for the following components:   WBC 18.0 (*)    All other components within normal limits  SARS CORONAVIRUS 2 BY RT PCR (HOSPITAL ORDER, [facility])  LIPASE, BLOOD  URINALYSIS, ROUTINE W REFLEX MICROSCOPIC  I-STAT BETA HCG BLOOD, ED ([facility], [facility], [facility] ONLY)    EKG None  Radiology CT ABDOMEN PELVIS W CONTRAST  Result Date: 09/12/2019 CLINICAL DATA:  Right lower quadrant abdominal pain EXAM: CT ABDOMEN AND PELVIS WITH CONTRAST TECHNIQUE: Multidetector CT imaging  of the abdomen and pelvis was performed using the standard protocol following bolus administration of intravenous contrast. CONTRAST:  110mL OMNIPAQUE IOHEXOL 300 MG/ML  SOLN COMPARISON:  None. FINDINGS: LOWER CHEST: Normal. HEPATOBILIARY: Normal hepatic contours. No intra- or extrahepatic biliary dilatation. Normal gallbladder PANCREAS: Normal pancreas. No ductal dilatation or peripancreatic fluid collection. SPLEEN: Normal. ADRENALS/URINARY TRACT: The adrenal glands are normal. No hydronephrosis, nephroureterolithiasis or solid renal mass. The urinary bladder is normal for degree of distention STOMACH/BOWEL: There is no hiatal hernia. Normal duodenal course and caliber. No small bowel dilatation or inflammation. No focal colonic abnormality. Appendix location: Pelvic (5 o'clock) Diameter: 11 mm Appendicolith: Yes, sagittal image 81 Mucosal hyper-enhancement: Minimal Extraluminal gas: None Periappendiceal collection: None VASCULAR/LYMPHATIC: Normal course and caliber of the major abdominal vessels. No abdominal or pelvic lymphadenopathy. REPRODUCTIVE: There bilateral dermoid cysts. The left measures 8.2 x 6.6 cm and the right measures 2.4 x 1.4 cm. MUSCULOSKELETAL. No bony spinal canal stenosis or focal osseous abnormality. OTHER:  None. IMPRESSION: 1. Dilated appendix with mild mucosal hyper enhancement, consistent with acute appendicitis. No evidence of perforation or abscess formation. 2. Bilateral dermoid cysts, measuring up to 8.2 cm on the left and 2.4 cm on the right. Electronically Signed   By: Ulyses Jarred M.D.   On: 09/12/2019 01:17    Procedures Procedures (including critical care time)  Medications Ordered in ED Medications  cefTRIAXone (ROCEPHIN) 2 g in sodium chloride 0.9 % 100 mL IVPB (has no administration in time range)    And  metroNIDAZOLE (FLAGYL) IVPB 500 mg (500 mg Intravenous New Bag/Given 09/12/19 0029)  sodium chloride (PF) 0.9 % injection (has no administration in time range)  promethazine (PHENERGAN) injection 25 mg (has no administration in time range)  HYDROmorphone (DILAUDID) injection 0.5 mg (has no administration in time range)  ondansetron (ZOFRAN) injection 4 mg (4 mg Intravenous Given 09/12/19 0025)  sodium chloride 0.9 % bolus 1,000 mL (1,000 mLs Intravenous New Bag/Given 09/12/19 0030)  morphine 4 MG/ML injection 4 mg (4 mg Intravenous Given 09/12/19 0025)  iohexol (OMNIPAQUE) 300 MG/ML solution 100 mL (100 mLs Intravenous Contrast Given 09/12/19 0043)    ED Course  I have reviewed the triage vital signs and the nursing notes.  Pertinent labs & imaging results that were available during my care of the patient were reviewed by me and considered in my medical decision making (see chart for details).    MDM Rules/Calculators/A&P                      24 year old female with a history of Ehlers-Danlos syndrome presenting with upper abdominal pain that is since migrated to the umbilicus and right lower quadrant, onset tonight that has been accompanied by 3 episodes NBNB vomiting.  No constitutional symptoms.  Vital signs are unremarkable.  On exam, she has voluntary guarding and tenderness in the right lower quadrant as well as tenderness over McBurney's point.  There is no rebound, but  she does have a positive Rovsing sign.  Labs are also notable for leukocytosis of 18K.  Labs are otherwise unremarkable.  Urinalysis is not concerning.  Pregnancy test is negative.  Given her labs and presentation, I am most concerned for appendicitis.  Will order CT abdomen pelvis, morphine, Zofran, and empirically treat her with Rocephin and Flagyl.  Low suspicion for ectopic pregnancy, ovarian torsion, cholecystitis, pancreatitis, diverticulitis, or mesenteric ischemia at this time.  CT abdomen pelvis with uncomplicated appendicitis.  Of note, patient does also  have 2 dermoid cyst of the bilateral ovaries, including an 8.2 cm dermoid cyst on the left..  Patient is not currently established with an OB/GYN.  Will make general surgery aware.  On reevaluation, she reports that pain was improved, but is now increasing.  Will reorder pain medication and antiemetics.  Consult to general surgery and COVID-19 test has been ordered.  Spoke with Dr. Marcello Moores, general surgery.  She will admit with surgical evaluation in the morning and likely appendectomy.  We will keep patient n.p.o.  Patient remains hemodynamically stable with normal vital signs.  No acute distress.   The patient appears reasonably stabilized for admission considering the current resources, flow, and capabilities available in the ED at this time, and I doubt any other [facility] requiring further screening and/or treatment in the ED prior to admission.    Final Clinical Impression(s) / ED Diagnoses Final diagnoses:  Acute appendicitis, uncomplicated  Bilateral dermoid cysts of ovaries    Rx / DC Orders ED Discharge Orders    None       Joanne Gavel, PA-C [id number]    Delora Fuel, MD [phone]

## 2019-09-11 NOTE — ED Triage Notes (Signed)
Patient c/o abdominal pain x3 hours. Denies N/V/D. Last BM today. Reports taking Tums and meclizine PTA without relief.

## 2019-09-12 ENCOUNTER — Encounter (HOSPITAL_COMMUNITY)
Admission: EM | Disposition: A | Discharge status: Home / Self Care | Payer: Self-pay | Source: Home / Self Care | Attending: Emergency Medicine

## 2019-09-12 ENCOUNTER — Other Ambulatory Visit: Payer: Self-pay

## 2019-09-12 ENCOUNTER — Emergency Department (HOSPITAL_COMMUNITY): Payer: BC Managed Care – PPO

## 2019-09-12 ENCOUNTER — Encounter (HOSPITAL_COMMUNITY): Payer: Self-pay

## 2019-09-12 ENCOUNTER — Observation Stay (HOSPITAL_COMMUNITY): Payer: BC Managed Care – PPO | Admitting: Anesthesiology

## 2019-09-12 DIAGNOSIS — F418 Other specified anxiety disorders: Secondary | ICD-10-CM | POA: Diagnosis not present

## 2019-09-12 DIAGNOSIS — K3580 Unspecified acute appendicitis: Secondary | ICD-10-CM | POA: Diagnosis not present

## 2019-09-12 DIAGNOSIS — R109 Unspecified abdominal pain: Secondary | ICD-10-CM | POA: Diagnosis not present

## 2019-09-12 DIAGNOSIS — K37 Unspecified appendicitis: Secondary | ICD-10-CM | POA: Diagnosis present

## 2019-09-12 DIAGNOSIS — K353 Acute appendicitis with localized peritonitis, without perforation or gangrene: Secondary | ICD-10-CM | POA: Diagnosis not present

## 2019-09-12 HISTORY — PX: LAPAROSCOPIC APPENDECTOMY: SHX408

## 2019-09-12 LAB — HIV ANTIBODY (ROUTINE TESTING W REFLEX): HIV Screen 4th Generation wRfx: NONREACTIVE

## 2019-09-12 LAB — SARS CORONAVIRUS 2 BY RT PCR (HOSPITAL ORDER, PERFORMED IN ~~LOC~~ HOSPITAL LAB): SARS Coronavirus 2: NEGATIVE

## 2019-09-12 SURGERY — APPENDECTOMY, LAPAROSCOPIC
Anesthesia: General | Site: Abdomen

## 2019-09-12 MED ORDER — OXYCODONE HCL 5 MG PO TABS: 5.0000 mg | ORAL_TABLET | Freq: Once | ORAL | Status: DC | PRN

## 2019-09-12 MED ORDER — ONDANSETRON HCL 4 MG/2ML IJ SOLN: 4.0000 mg | Freq: Four times a day (QID) | INTRAMUSCULAR | Status: DC | PRN

## 2019-09-12 MED ORDER — LACTATED RINGERS IV SOLN
INTRAVENOUS | Status: DC | PRN
Start: 2019-09-12 — End: 2019-09-12

## 2019-09-12 MED ORDER — FENTANYL CITRATE (PF) 100 MCG/2ML IJ SOLN
INTRAMUSCULAR | Status: DC | PRN
  Administered 2019-09-12 (×2): 50 ug via INTRAVENOUS

## 2019-09-12 MED ORDER — ACETAMINOPHEN 325 MG PO TABS: 325.0000 mg | ORAL_TABLET | Freq: Once | ORAL | Status: DC | PRN

## 2019-09-12 MED ORDER — ACETAMINOPHEN 10 MG/ML IV SOLN: 1000.0000 mg | Freq: Once | INTRAVENOUS | Status: DC | PRN

## 2019-09-12 MED ORDER — DIPHENHYDRAMINE HCL 50 MG/ML IJ SOLN: 25.0000 mg | Freq: Four times a day (QID) | INTRAMUSCULAR | Status: DC | PRN

## 2019-09-12 MED ORDER — BUPIVACAINE-EPINEPHRINE (PF) 0.5% -1:200000 IJ SOLN
INTRAMUSCULAR | Status: AC
  Filled 2019-09-12: qty 30

## 2019-09-12 MED ORDER — MIDAZOLAM HCL 2 MG/2ML IJ SOLN
INTRAMUSCULAR | Status: AC
  Filled 2019-09-12: qty 2

## 2019-09-12 MED ORDER — FENTANYL CITRATE (PF) 250 MCG/5ML IJ SOLN
INTRAMUSCULAR | Status: AC
  Filled 2019-09-12: qty 5

## 2019-09-12 MED ORDER — PROPOFOL 10 MG/ML IV BOLUS
INTRAVENOUS | Status: AC
  Filled 2019-09-12: qty 20

## 2019-09-12 MED ORDER — LIDOCAINE 2% (20 MG/ML) 5 ML SYRINGE
INTRAMUSCULAR | Status: DC | PRN
  Administered 2019-09-12 (×2): 40 mg via INTRAVENOUS

## 2019-09-12 MED ORDER — HYDROMORPHONE HCL 1 MG/ML IJ SOLN
0.5000 mg | Freq: Once | INTRAMUSCULAR | Status: AC
  Administered 2019-09-12: 0.5 mg via INTRAVENOUS
  Filled 2019-09-12: qty 1

## 2019-09-12 MED ORDER — FENTANYL CITRATE (PF) 100 MCG/2ML IJ SOLN
INTRAMUSCULAR | Status: AC
  Filled 2019-09-12: qty 2

## 2019-09-12 MED ORDER — ROCURONIUM BROMIDE 10 MG/ML (PF) SYRINGE
PREFILLED_SYRINGE | INTRAVENOUS | Status: DC | PRN
  Administered 2019-09-12: 40 mg via INTRAVENOUS

## 2019-09-12 MED ORDER — MIDAZOLAM HCL 2 MG/2ML IJ SOLN
INTRAMUSCULAR | Status: DC | PRN
  Administered 2019-09-12 (×2): 1 mg via INTRAVENOUS

## 2019-09-12 MED ORDER — DEXAMETHASONE SODIUM PHOSPHATE 10 MG/ML IJ SOLN
INTRAMUSCULAR | Status: DC | PRN
Start: 2019-09-12 — End: 2019-09-12
  Administered 2019-09-12: 10 mg via INTRAVENOUS

## 2019-09-12 MED ORDER — SUCCINYLCHOLINE CHLORIDE 200 MG/10ML IV SOSY
PREFILLED_SYRINGE | INTRAVENOUS | Status: DC | PRN
  Administered 2019-09-12: 120 mg via INTRAVENOUS

## 2019-09-12 MED ORDER — KCL IN DEXTROSE-NACL 20-5-0.45 MEQ/L-%-% IV SOLN
INTRAVENOUS | Status: DC
  Filled 2019-09-12 (×3): qty 1000

## 2019-09-12 MED ORDER — DIPHENHYDRAMINE HCL 25 MG PO CAPS: 25.0000 mg | ORAL_CAPSULE | Freq: Four times a day (QID) | ORAL | Status: DC | PRN

## 2019-09-12 MED ORDER — SODIUM CHLORIDE (PF) 0.9 % IJ SOLN
INTRAMUSCULAR | Status: AC
  Filled 2019-09-12: qty 50

## 2019-09-12 MED ORDER — PHENYLEPHRINE 40 MCG/ML (10ML) SYRINGE FOR IV PUSH (FOR BLOOD PRESSURE SUPPORT)
PREFILLED_SYRINGE | INTRAVENOUS | Status: DC | PRN
  Administered 2019-09-12: 80 ug via INTRAVENOUS
  Administered 2019-09-12: 120 ug via INTRAVENOUS

## 2019-09-12 MED ORDER — ACETAMINOPHEN 160 MG/5ML PO SOLN: 325.0000 mg | Freq: Once | ORAL | Status: DC | PRN

## 2019-09-12 MED ORDER — VENLAFAXINE HCL ER 75 MG PO CP24
75.0000 mg | ORAL_CAPSULE | Freq: Every day | ORAL | Status: DC
  Filled 2019-09-12: qty 1

## 2019-09-12 MED ORDER — PROMETHAZINE HCL 25 MG/ML IJ SOLN
25.0000 mg | Freq: Once | INTRAMUSCULAR | Status: AC
  Administered 2019-09-12: 25 mg via INTRAVENOUS
  Filled 2019-09-12: qty 1

## 2019-09-12 MED ORDER — LACTATED RINGERS IR SOLN
Status: DC | PRN
  Administered 2019-09-12: 1000 mL

## 2019-09-12 MED ORDER — LACTATED RINGERS IV SOLN: INTRAVENOUS | Status: DC

## 2019-09-12 MED ORDER — MORPHINE SULFATE (PF) 2 MG/ML IV SOLN
2.0000 mg | INTRAVENOUS | Status: DC | PRN
  Administered 2019-09-12 – 2019-09-13 (×7): 2 mg via INTRAVENOUS
  Filled 2019-09-12 (×7): qty 1

## 2019-09-12 MED ORDER — IOHEXOL 300 MG/ML  SOLN
100.0000 mL | Freq: Once | INTRAMUSCULAR | Status: AC | PRN
  Administered 2019-09-12: 100 mL via INTRAVENOUS

## 2019-09-12 MED ORDER — BUPIVACAINE-EPINEPHRINE 0.5% -1:200000 IJ SOLN
INTRAMUSCULAR | Status: DC | PRN
  Administered 2019-09-12: 18 mL

## 2019-09-12 MED ORDER — ONDANSETRON HCL 4 MG/2ML IJ SOLN
INTRAMUSCULAR | Status: DC | PRN
  Administered 2019-09-12: 4 mg via INTRAVENOUS

## 2019-09-12 MED ORDER — ONDANSETRON 4 MG PO TBDP: 4.0000 mg | ORAL_TABLET | Freq: Four times a day (QID) | ORAL | Status: DC | PRN

## 2019-09-12 MED ORDER — TRAMADOL HCL 50 MG PO TABS: 50.0000 mg | ORAL_TABLET | Freq: Four times a day (QID) | ORAL | 0 refills | Status: AC | PRN

## 2019-09-12 MED ORDER — PROPOFOL 10 MG/ML IV BOLUS
INTRAVENOUS | Status: DC | PRN
  Administered 2019-09-12: 140 mg via INTRAVENOUS

## 2019-09-12 MED ORDER — HYDROMORPHONE HCL 1 MG/ML IJ SOLN: 0.2500 mg | INTRAMUSCULAR | Status: DC | PRN

## 2019-09-12 MED ORDER — PROMETHAZINE HCL 25 MG/ML IJ SOLN: 6.2500 mg | INTRAMUSCULAR | Status: DC | PRN

## 2019-09-12 MED ORDER — ENOXAPARIN SODIUM 40 MG/0.4ML ~~LOC~~ SOLN: 40.0000 mg | SUBCUTANEOUS | Status: DC

## 2019-09-12 MED ORDER — SUGAMMADEX SODIUM 500 MG/5ML IV SOLN
INTRAVENOUS | Status: DC | PRN
  Administered 2019-09-12: 300 mg via INTRAVENOUS

## 2019-09-12 MED ORDER — FENTANYL CITRATE (PF) 250 MCG/5ML IJ SOLN
INTRAMUSCULAR | Status: DC | PRN
  Administered 2019-09-12: 150 ug via INTRAVENOUS
  Administered 2019-09-12 (×2): 50 ug via INTRAVENOUS

## 2019-09-12 MED ORDER — MEPERIDINE HCL 50 MG/ML IJ SOLN: 6.2500 mg | INTRAMUSCULAR | Status: DC | PRN

## 2019-09-12 MED ORDER — 0.9 % SODIUM CHLORIDE (POUR BTL) OPTIME
TOPICAL | Status: DC | PRN
  Administered 2019-09-12: 1000 mL

## 2019-09-12 MED ORDER — SCOPOLAMINE 1 MG/3DAYS TD PT72
MEDICATED_PATCH | TRANSDERMAL | Status: AC
  Filled 2019-09-12: qty 1

## 2019-09-12 MED ORDER — OXYCODONE HCL 5 MG/5ML PO SOLN: 5.0000 mg | Freq: Once | ORAL | Status: DC | PRN

## 2019-09-12 SURGICAL SUPPLY — 40 items
APPLIER CLIP 5 13 M/L LIGAMAX5 (MISCELLANEOUS)
APPLIER CLIP ROT 10 11.4 M/L (STAPLE)
CABLE HIGH FREQUENCY MONO STRZ (ELECTRODE) ×2 IMPLANT
CHLORAPREP W/TINT 26 (MISCELLANEOUS) ×2 IMPLANT
CLIP APPLIE 5 13 M/L LIGAMAX5 (MISCELLANEOUS) IMPLANT
CLIP APPLIE ROT 10 11.4 M/L (STAPLE) IMPLANT
COVER WAND RF STERILE (DRAPES) IMPLANT
DECANTER SPIKE VIAL GLASS SM (MISCELLANEOUS) ×2 IMPLANT
DERMABOND ADVANCED (GAUZE/BANDAGES/DRESSINGS) ×1
DERMABOND ADVANCED .7 DNX12 (GAUZE/BANDAGES/DRESSINGS) ×1 IMPLANT
DRAPE LAPAROSCOPIC ABDOMINAL (DRAPES) IMPLANT
ELECT REM PT RETURN 15FT ADLT (MISCELLANEOUS) ×2 IMPLANT
GLOVE BIO SURGEON STRL SZ 6.5 (GLOVE) ×2 IMPLANT
GLOVE BIOGEL PI IND STRL 7.0 (GLOVE) ×1 IMPLANT
GLOVE BIOGEL PI INDICATOR 7.0 (GLOVE) ×1
GOWN STRL REUS W/TWL XL LVL3 (GOWN DISPOSABLE) ×4 IMPLANT
GRASPER SUT TROCAR 14GX15 (MISCELLANEOUS) IMPLANT
HANDLE STAPLE EGIA 4 XL (STAPLE) ×2 IMPLANT
IRRIG SUCT STRYKERFLOW 2 WTIP (MISCELLANEOUS) ×2
IRRIGATION SUCT STRKRFLW 2 WTP (MISCELLANEOUS) ×1 IMPLANT
KIT BASIN (CUSTOM PROCEDURE TRAY) ×2 IMPLANT
KIT TURNOVER KIT A (KITS) IMPLANT
MARKER SKIN DUAL TIP RULER LAB (MISCELLANEOUS) IMPLANT
PENCIL SMOKE EVACUATOR (MISCELLANEOUS) IMPLANT
POUCH SPECIMEN RETRIEVAL 10MM (ENDOMECHANICALS) IMPLANT
RELOAD EGIA 45 MED/THCK PURPLE (STAPLE) ×2 IMPLANT
RELOAD EGIA 45 TAN VASC (STAPLE) ×2 IMPLANT
RELOAD EGIA 60 MED/THCK PURPLE (STAPLE) IMPLANT
RELOAD EGIA 60 TAN VASC (STAPLE) IMPLANT
SCISSORS LAP 5X35 DISP (ENDOMECHANICALS) IMPLANT
SLEEVE XCEL OPT CAN 5 100 (ENDOMECHANICALS) IMPLANT
SUT VIC AB 2-0 SH 27 (SUTURE)
SUT VIC AB 2-0 SH 27X BRD (SUTURE) IMPLANT
SUT VIC AB 4-0 PS2 27 (SUTURE) ×2 IMPLANT
SUT VICRYL 0 UR6 27IN ABS (SUTURE) IMPLANT
TOWEL OR 17X26 10 PK STRL BLUE (TOWEL DISPOSABLE) ×2 IMPLANT
TRAY FOLEY MTR SLVR 16FR STAT (SET/KITS/TRAYS/PACK) ×2 IMPLANT
TRAY LAPAROSCOPIC (CUSTOM PROCEDURE TRAY) ×2 IMPLANT
TROCAR BLADELESS OPT 5 100 (ENDOMECHANICALS) IMPLANT
TROCAR XCEL BLUNT TIP 100MML (ENDOMECHANICALS) ×2 IMPLANT

## 2019-09-12 NOTE — Transfer of Care (Signed)
Immediate Anesthesia Transfer of Care Note  Patient: Madason Hove  Procedure(s) Performed: APPENDECTOMY LAPAROSCOPIC (N/A Abdomen)  Patient Location: PACU  Anesthesia Type:General  Level of Consciousness: awake and alert   Airway & Oxygen Therapy: Patient Spontanous Breathing and Patient connected to face mask oxygen  Post-op Assessment: Report given to RN and Post -op Vital signs reviewed and stable  Post vital signs: Reviewed and stable  Last Vitals:  Vitals Value Taken Time  BP 154/81 09/12/19 1021  Temp    Pulse 113 09/12/19 1025  Resp 18 09/12/19 1025  SpO2 100 % 09/12/19 1025  Vitals shown include unvalidated device data.  Last Pain:  Vitals:   09/12/19 0759  TempSrc:   PainSc: 3       Patients Stated Pain Goal: 2 ([phone])  Complications: No apparent anesthesia complications

## 2019-09-12 NOTE — Anesthesia Postprocedure Evaluation (Signed)
Anesthesia Post Note  Patient: Linda Shea  Procedure(s) Performed: APPENDECTOMY LAPAROSCOPIC (N/A Abdomen)     Patient location during evaluation: PACU Anesthesia Type: General Level of consciousness: awake and alert Pain management: pain level controlled Vital Signs Assessment: post-procedure vital signs reviewed and stable Respiratory status: spontaneous breathing, nonlabored ventilation, respiratory function stable and patient connected to nasal cannula oxygen Cardiovascular status: blood pressure returned to baseline and stable Postop Assessment: no apparent nausea or vomiting Anesthetic complications: no    Last Vitals:  Vitals:   09/12/19 1127 09/12/19 1146  BP: 114/68 132/72  Pulse: 100 83  Resp: 16 16  Temp: 37.8 C 37.4 C  SpO2: 96% 96%    Last Pain:  Vitals:   09/12/19 1146  TempSrc: Oral  PainSc:                  Effie Berkshire

## 2019-09-12 NOTE — Anesthesia Preprocedure Evaluation (Addendum)
Anesthesia Evaluation  Patient identified by MRN, date of birth, ID band Patient awake    Reviewed: Allergy & Precautions, NPO status , Patient's Chart, lab work & pertinent test results  Airway Mallampati: II  TM Distance: >3 FB Neck ROM: Full    Dental  (+) Teeth Intact, Dental Advisory Given   Pulmonary neg pulmonary ROS,    breath sounds clear to auscultation       Cardiovascular negative cardio ROS   Rhythm:Regular Rate:Normal     Neuro/Psych PSYCHIATRIC DISORDERS Anxiety Depression negative neurological ROS     GI/Hepatic negative GI ROS, Neg liver ROS,   Endo/Other  negative endocrine ROS  Renal/GU negative Renal ROS     Musculoskeletal negative musculoskeletal ROS (+)   Abdominal Normal abdominal exam  (+)   Peds  Hematology negative hematology ROS (+)   Anesthesia Other Findings   Reproductive/Obstetrics                            Anesthesia Physical Anesthesia Plan  ASA: II  Anesthesia Plan: General   Post-op Pain Management:    Induction: Intravenous  PONV Risk Score and Plan: Ondansetron, Dexamethasone, Midazolam and Scopolamine patch - Pre-op  Airway Management Planned: Oral ETT  Additional Equipment: None  Intra-op Plan:   Post-operative Plan: Extubation in OR  Informed Consent: I have reviewed the patients History and Physical, chart, labs and discussed the procedure including the risks, benefits and alternatives for the proposed anesthesia with the patient or authorized representative who has indicated his/her understanding and acceptance.     Dental advisory given  Plan Discussed with: CRNA  Anesthesia Plan Comments:        Anesthesia Quick Evaluation

## 2019-09-12 NOTE — Discharge Instructions (Signed)
POST OP INSTRUCTIONS  1. DIET: As tolerated. Follow a light bland diet the first 24 hours after arrival home, such as soup, liquids, crackers, etc.  Be sure to include lots of fluids daily.  Avoid fast food or heavy meals as your are more likely to get nauseated.  Eat a low fat the next few days after surgery.  2. Take your usually prescribed home medications unless otherwise directed.  3. PAIN CONTROL: a. Pain is best controlled by a usual combination of three different methods TOGETHER: i. Ice/Heat ii. Over the counter pain medication iii. Prescription pain medication b. Most patients will experience some swelling and bruising around the surgical site.  Ice packs or heating pads (30-60 minutes up to 6 times a day) will help. Some people prefer to use ice alone, heat alone, alternating between ice & heat.  Experiment to what works for you.  Swelling and bruising can take several weeks to resolve.   c. It is helpful to take an over-the-counter pain medication regularly for the first few weeks: i. Ibuprofen (Motrin/Advil) - 200mg  tabs - take 3 tabs (600mg ) every 6 hours as needed for pain ii. Acetaminophen (Tylenol) - you may take 650mg  every 6 hours as needed. You can take this with motrin as they act differently on the body. If you are taking a narcotic pain medication that has acetaminophen in it, do not take over the counter tylenol at the same time.  Iii. NOTE: You may take both of these medications together - most patients  find it most helpful when alternating between the two (i.e. Ibuprofen at 6am,  tylenol at 9am, ibuprofen at 12pm ...) d. A  prescription for pain medication should be given to you upon discharge.  Take your pain medication as prescribed if your pain is not adequatly controlled with the over-the-counter pain reliefs mentioned above.  4. Avoid getting constipated.  Between the surgery and the pain medications, it is common to experience some constipation.  Increasing fluid  intake and taking a fiber supplement (such as Metamucil, Citrucel, FiberCon, MiraLax, etc) 1-2 times a day regularly will usually help prevent this problem from occurring.  A mild laxative (prune juice, Milk of Magnesia, MiraLax, etc) should be taken according to package directions if there are no bowel movements after 48 hours.    5. Dressing: You may bathe normally 48 hours after your surgery in a shower. Avoid baths/pools/lakes/oceans until your wounds have fully healed. You may shower sooner than 48 hours but make sure to keep incisions covered with a waterproof dressing.  6. ACTIVITIES as tolerated:   a. Avoid heavy lifting (>10lbs or 1 gallon of milk) for the next 6 weeks. b. You may resume regular (light) daily activities beginning the next day--such as daily self-care, walking, climbing stairs--gradually increasing activities as tolerated.  If you can walk 30 minutes without difficulty, it is safe to try more intense activity such as jogging, treadmill, bicycling, low-impact aerobics.  c. DO NOT PUSH THROUGH PAIN.  Let pain be your guide: If it hurts to do something, don't do it. d. Dennis Bast may drive when you are no longer taking prescription pain medication, you can comfortably wear a seatbelt, and you can safely maneuver your car and apply brakes.  7. FOLLOW UP in our office a. Please call [facility] at [phone] to set up an appointment to see your surgeon or one of our PAs in the office for a follow-up appointment approximately 2 weeks after your surgery. b.  Make sure that you call for this appointment the day you arrive home to insure a convenient appointment time.  9. If you have disability or family leave forms that need to be completed, you may have them completed by your primary care physician's office; for return to work instructions, please ask our office staff and they will be happy to assist you in obtaining this documentation   When to call us [phone]: 1. Poor pain  control 2. Reactions / problems with new medications (rash/itching, etc)  3. Fever over 101.5 F (38.5 C) 4. Inability to urinate 5. Nausea/vomiting 6. Worsening swelling or bruising 7. Continued bleeding from incision. 8. Increased pain, redness, or drainage from the incision  The clinic staff is available to answer your questions during regular business hours (8:30am-5pm).  Please don't hesitate to call and ask to speak to one of our nurses for clinical concerns.   A surgeon from [facility] is always on call at the hospitals   If you have a medical emergency, go to the nearest emergency room or call 911.  [facility], [place] [address], [place], [place], [place]  [address] MAIN: [phone] FAX: [phone] [email or web address]

## 2019-09-12 NOTE — Anesthesia Procedure Notes (Signed)
Date/Time: 09/12/2019 10:16 AM Performed by: Cynda Familia, CRNA Oxygen Delivery Method: Simple face mask Placement Confirmation: positive ETCO2 and breath sounds checked- equal and bilateral Dental Injury: Teeth and Oropharynx as per pre-operative assessment

## 2019-09-12 NOTE — Plan of Care (Signed)
  Problem: Education: Goal: Knowledge of General Education information will improve Description Including pain rating scale, medication(s)/side effects and non-pharmacologic comfort measures Outcome: Progressing   Problem: Nutrition: Goal: Adequate nutrition will be maintained Outcome: Progressing   Problem: Pain Managment: Goal: General experience of comfort will improve Outcome: Progressing   

## 2019-09-12 NOTE — Op Note (Addendum)
Linda Shea [id number]   PRE-OPERATIVE DIAGNOSIS:  Acute Appendicitis  POST-OPERATIVE DIAGNOSIS:  Acute Appendicitis   Procedure(s): APPENDECTOMY LAPAROSCOPIC   Surgeon(s): Leighton Ruff, MD  ASSISTANT: none   ANESTHESIA:   local and general  EBL:   20 ml  Delay start of Pharmacological VTE agent (>24hrs) due to surgical blood loss or risk of bleeding:  no  DRAINS: none   SPECIMEN:  Source of Specimen:  appendix  DISPOSITION OF SPECIMEN:  PATHOLOGY  COUNTS:  YES  PLAN OF CARE: Pt admitted  PATIENT DISPOSITION:  PACU - hemodynamically stable.   INDICATIONS: Patient with concerning symptoms & work up suspicious for appendicitis.  Surgery was recommended:  The anatomy & physiology of the digestive tract was discussed.  The pathophysiology of appendicitis was discussed.  Natural history risks without surgery was discussed.   I feel the risks of no intervention will lead to serious problems that outweigh the operative risks; therefore, I recommended diagnostic laparoscopy with removal of appendix to remove the pathology.  Laparoscopic & open techniques were discussed.   I noted a good likelihood this will help address the problem.    Risks such as bleeding, infection, abscess, leak, reoperation, possible ostomy, hernia, heart attack, death, and other risks were discussed.  Goals of post-operative recovery were discussed as well.  We will work to minimize complications.  Questions were answered.  The patient expresses understanding & wishes to proceed with surgery.  OR FINDINGS: 24 y.o. F with acute appendicitis.  Bilateral dermoid cysts of the ovaries (see photo below)  DESCRIPTION:   The patient was identified & brought into the operating room. The patient was positioned supine with left arm tucked. SCDs were active during the entire case. The patient underwent general anesthesia without any difficulty.  A foley catheter was inserted under sterile conditions. The abdomen was  prepped and draped in a sterile fashion. A Surgical Timeout confirmed our plan.   I made a transverse incision through the superior umbilical fold.  I made a nick in the infraumbilical fascia and confirmed peritoneal entry.  I placed a stay suture and then the [facility] port.  We induced carbon dioxide insufflation.  Camera inspection revealed no injury.  I placed additional ports under direct laparoscopic visualization.  I mobilized the terminal ileum to proximal ascending colon in a lateral to medial fashion.  I took care to avoid injuring any retroperitoneal structures.   I freed the appendix off its attachments to the ascending colon and cecal mesentery.  I elevated the appendix.  I was able to free off the base of the appendix, which was still viable.  I stapled the appendix off the cecum using a laparoscopic bowel load stapler.  I took a healthy cuff viable cecum. I skeletonized & ligated the mesoappendix with a vascular load Covidian stapler.   I placed the appendix inside an EndoCatch bag and removed out the [place] port.  I did copious irrigation. Hemostasis was good in the mesoappendix, colon mesentery, and retroperitoneum. Staple line was intact on the cecum with no bleeding. I washed out the pelvis, retrohepatic space and right paracolic gutter.  Hemostasis is good. There was no perforation or injury.  Because the area cleaned up well after irrigation, I did not place a drain.  I aspirated the carbon dioxide. I removed the ports. I closed the umbilical fascia site using a 0 Vicryl stitch. I closed skin using 4-0 vicryl stitch.  Sterile dressings were applied.  Patient was extubated and sent  to the recovery room.  I discussed the operative findings with the patient's family. I suspect the patient is going used in the hospital at least overnight and will need antibiotics for 0 more days. Questions answered. They expressed understanding and appreciation.

## 2019-09-12 NOTE — Anesthesia Procedure Notes (Addendum)
Procedure Name: Intubation Date/Time: 09/12/2019 9:15 AM Performed by: Cynda Familia, CRNA Pre-anesthesia Checklist: Patient identified, Emergency Drugs available, Suction available and Patient being monitored Patient Re-evaluated:Patient Re-evaluated prior to induction Oxygen Delivery Method: Circle System Utilized Preoxygenation: Pre-oxygenation with 100% oxygen Induction Type: IV induction, Rapid sequence and Cricoid Pressure applied Ventilation: Mask ventilation without difficulty Laryngoscope Size: Miller and 2 Grade View: Grade I Tube type: Oral Tube size: 7.0 mm Number of attempts: 1 Airway Equipment and Method: Stylet Placement Confirmation: ETT inserted through vocal cords under direct vision,  positive ETCO2 and breath sounds checked- equal and bilateral Secured at: 21 cm Tube secured with: Tape Dental Injury: Teeth and Oropharynx as per pre-operative assessment  Comments: Smooth IV induction [place]-- intubation AM CRNA atraumatic-- teeth and mouth as preop -- bilat BS [employer]

## 2019-09-12 NOTE — H&P (Signed)
CC: abd pain   HPI: Linda Shea is an 24 y.o. female with history of Ehlers-Danlos disease who presents to the emergency department with a chief complaint of abdominal pain.  The patient reports that she developed upper abdominal pain yesterday evening.  She reports that the abdominal pain has now moved from around her umbilicus and into the right lower quadrant.  Pain is constant and worsening since onset.  Pain is worse with positional changes.   She has had 3 episodes vomiting.  She has had a poor appetite for most of the day.  No fever, chills, diarrhea, constipation, dysuria, hematuria, flank pain, vaginal bleeding, pain, or discharge.    No history of abdominal surgery.  She attempted to take meclizine and Tums prior to arrival with minimal, short-lived improvement in her symptoms.  She does not take any daily medications.   Past Medical History:  Diagnosis Date  . Ehlers-Danlos disease     Past Surgical History:  Procedure Laterality Date  . KNEE SURGERY      Family History  Problem Relation Age of Onset  . Hypertension Other   . Diabetes Other     Social:  reports that she has never smoked. She has never used smokeless tobacco. She reports current alcohol use. She reports that she does not use drugs.  Allergies: No Known Allergies  Medications: I have reviewed the patient's current medications.  Results for orders placed or performed during the hospital encounter of 09/11/19 (from the past 48 hour(s))  Urinalysis, Routine w reflex microscopic     Status: None   Collection Time: 09/11/19 10:12 PM  Result Value Ref Range   Color, Urine YELLOW YELLOW   APPearance CLEAR CLEAR   Specific Gravity, Urine 1.024 1.005 - 1.030   pH 5.0 5.0 - 8.0   Glucose, UA NEGATIVE NEGATIVE mg/dL   Hgb urine dipstick NEGATIVE NEGATIVE   Bilirubin Urine NEGATIVE NEGATIVE   Ketones, ur NEGATIVE NEGATIVE mg/dL   Protein, ur NEGATIVE NEGATIVE mg/dL   Nitrite NEGATIVE NEGATIVE    Leukocytes,Ua NEGATIVE NEGATIVE    Comment: Performed at [facility], [place] [address] [place], [place] [address]  Lipase, blood     Status: None   Collection Time: 09/11/19 10:20 PM  Result Value Ref Range   Lipase 21 11 - 51 U/L    Comment: Performed at [facility], [place] [address], [place], [place] [address]  Comprehensive metabolic panel     Status: Abnormal   Collection Time: 09/11/19 10:20 PM  Result Value Ref Range   Sodium 138 135 - 145 mmol/L   Potassium 4.3 3.5 - 5.1 mmol/L   Chloride 101 98 - 111 mmol/L   CO2 26 22 - 32 mmol/L   Glucose, Bld 111 (H) 70 - 99 mg/dL    Comment: Glucose reference range applies only to samples taken after fasting for at least 8 hours.   BUN 12 6 - 20 mg/dL   Creatinine, Ser 0.79 0.44 - 1.00 mg/dL   Calcium 9.4 8.9 - 10.3 mg/dL   Total Protein 8.0 6.5 - 8.1 g/dL   Albumin 4.4 3.5 - 5.0 g/dL   AST 16 15 - 41 U/L   ALT 21 0 - 44 U/L   Alkaline Phosphatase 108 38 - 126 U/L   Total Bilirubin 0.5 0.3 - 1.2 mg/dL   GFR calc non Af Amer >60 >60 mL/min   GFR calc Af Amer >60 >60 mL/min   Anion gap  11 5 - 15    Comment: Performed at [facility], [place] [address] [place], [place] [address]  CBC     Status: Abnormal   Collection Time: 09/11/19 10:20 PM  Result Value Ref Range   WBC 18.0 (H) 4.0 - 10.5 K/uL   RBC 4.82 3.87 - 5.11 MIL/uL   Hemoglobin 13.6 12.0 - 15.0 g/dL   HCT 41.8 36.0 - 46.0 %   MCV 86.7 80.0 - 100.0 fL   MCH 28.2 26.0 - 34.0 pg   MCHC 32.5 30.0 - 36.0 g/dL   RDW 12.7 11.5 - 15.5 %   Platelets 298 150 - 400 K/uL   nRBC 0.0 0.0 - 0.2 %    Comment: Performed at [facility], [place] [address], [place], [place] [address]  I-Stat beta hCG blood, ED     Status: None   Collection Time: 09/11/19 10:24 PM  Result Value Ref Range   I-stat hCG, quantitative <5.0 <5 mIU/mL   Comment 3            Comment:   GEST. AGE      CONC.  (mIU/mL)   <=1 WEEK        5 -  50     2 WEEKS       50 - 500     3 WEEKS       100 - 10,000     4 WEEKS     1,000 - 30,000        FEMALE AND NON-PREGNANT FEMALE:     LESS THAN 5 mIU/mL   SARS Coronavirus 2 by RT PCR (hospital order, performed in [place] hospital lab) Nasopharyngeal Nasopharyngeal Swab     Status: None   Collection Time: 09/12/19  1:30 AM   Specimen: Nasopharyngeal Swab  Result Value Ref Range   SARS Coronavirus 2 NEGATIVE NEGATIVE    Comment: (NOTE) SARS-CoV-2 target nucleic acids are NOT DETECTED. The SARS-CoV-2 RNA is generally detectable in upper and lower respiratory specimens during the acute phase of infection. The lowest concentration of SARS-CoV-2 viral copies this assay can detect is 250 copies / mL. A negative result does not preclude SARS-CoV-2 infection and should not be used as the sole basis for treatment or other patient management decisions.  A negative result may occur with improper specimen collection / handling, submission of specimen other than nasopharyngeal swab, presence of viral mutation(s) within the areas targeted by this assay, and inadequate number of viral copies (<250 copies / mL). A negative result must be combined with clinical observations, patient history, and epidemiological information. Fact Sheet for Patients:   StrictlyIdeas.no Fact Sheet for Healthcare Providers: BankingDealers.co.za This test is not yet approved or cleared  by the [place] FDA and has been authorized for detection and/or diagnosis of SARS-CoV-2 by FDA under an Emergency Use Authorization (EUA).  This EUA will remain in effect (meaning this test can be used) for the duration of the COVID-19 declaration under Section 564(b)(1) of the Act, 21 U.S.C. section 360bbb-3(b)(1), unless the authorization is terminated or revoked sooner. Performed at [facility], [place] [address], [place], [place] [address]     CT ABDOMEN  PELVIS W CONTRAST  Result Date: 09/12/2019 CLINICAL DATA:  Right lower quadrant abdominal pain EXAM: CT ABDOMEN AND PELVIS WITH CONTRAST TECHNIQUE: Multidetector CT imaging of the abdomen and pelvis was performed using the standard protocol following bolus administration of intravenous contrast. CONTRAST:  129mL OMNIPAQUE  IOHEXOL 300 MG/ML  SOLN COMPARISON:  None. FINDINGS: LOWER CHEST: Normal. HEPATOBILIARY: Normal hepatic contours. No intra- or extrahepatic biliary dilatation. Normal gallbladder PANCREAS: Normal pancreas. No ductal dilatation or peripancreatic fluid collection. SPLEEN: Normal. ADRENALS/URINARY TRACT: The adrenal glands are normal. No hydronephrosis, nephroureterolithiasis or solid renal mass. The urinary bladder is normal for degree of distention STOMACH/BOWEL: There is no hiatal hernia. Normal duodenal course and caliber. No small bowel dilatation or inflammation. No focal colonic abnormality. Appendix location: Pelvic (5 o'clock) Diameter: 11 mm Appendicolith: Yes, sagittal image 81 Mucosal hyper-enhancement: Minimal Extraluminal gas: None Periappendiceal collection: None VASCULAR/LYMPHATIC: Normal course and caliber of the major abdominal vessels. No abdominal or pelvic lymphadenopathy. REPRODUCTIVE: There bilateral dermoid cysts. The left measures 8.2 x 6.6 cm and the right measures 2.4 x 1.4 cm. MUSCULOSKELETAL. No bony spinal canal stenosis or focal osseous abnormality. OTHER: None. IMPRESSION: 1. Dilated appendix with mild mucosal hyper enhancement, consistent with acute appendicitis. No evidence of perforation or abscess formation. 2. Bilateral dermoid cysts, measuring up to 8.2 cm on the left and 2.4 cm on the right. Electronically Signed   By: Ulyses Jarred M.D.   On: 09/12/2019 01:17    ROS - all of the below systems have been reviewed with the patient and positives are indicated with bold text General: chills, fever or night sweats Eyes: blurry vision or double vision ENT:  epistaxis or sore throat Allergy/Immunology: itchy/watery eyes or nasal congestion Hematologic/Lymphatic: bleeding problems, blood clots or swollen lymph nodes Endocrine: temperature intolerance or unexpected weight changes Breast: new or changing breast lumps or nipple discharge Resp: cough, shortness of breath, or wheezing CV: chest pain or dyspnea on exertion GI: as per HPI GU: dysuria, trouble voiding, or hematuria MSK: joint pain or joint stiffness Neuro: TIA or stroke symptoms Derm: pruritus and skin lesion changes Psych: anxiety and depression  PE Blood pressure 123/73, pulse 82, temperature 98.5 F (36.9 C), resp. rate 16, height 5\' 11"  (1.803 m), weight 90.7 kg, SpO2 99 %. Constitutional: NAD; conversant; no deformities Eyes: Moist conjunctiva; no lid lag; anicteric; PERRL Neck: Trachea midline; no thyromegaly Lungs: Normal respiratory effort; no tactile fremitus CV: RRR; no palpable thrills; no pitting edema GI: Abd TTP RLQ; no palpable hepatosplenomegaly MSK: Normal range of motion of extremities; no clubbing/cyanosis Psychiatric: Appropriate affect; alert and oriented x3 Lymphatic: No palpable cervical or axillary lymphadenopathy  Results for orders placed or performed during the hospital encounter of 09/11/19 (from the past 48 hour(s))  Urinalysis, Routine w reflex microscopic     Status: None   Collection Time: 09/11/19 10:12 PM  Result Value Ref Range   Color, Urine YELLOW YELLOW   APPearance CLEAR CLEAR   Specific Gravity, Urine 1.024 1.005 - 1.030   pH 5.0 5.0 - 8.0   Glucose, UA NEGATIVE NEGATIVE mg/dL   Hgb urine dipstick NEGATIVE NEGATIVE   Bilirubin Urine NEGATIVE NEGATIVE   Ketones, ur NEGATIVE NEGATIVE mg/dL   Protein, ur NEGATIVE NEGATIVE mg/dL   Nitrite NEGATIVE NEGATIVE   Leukocytes,Ua NEGATIVE NEGATIVE    Comment: Performed at [facility], [place] [address], [place], [place] [address]  Lipase, blood     Status: None   Collection  Time: 09/11/19 10:20 PM  Result Value Ref Range   Lipase 21 11 - 51 U/L    Comment: Performed at [facility], [place] [address] [place], [place] [address]  Comprehensive metabolic panel     Status: Abnormal   Collection Time: 09/11/19 10:20  PM  Result Value Ref Range   Sodium 138 135 - 145 mmol/L   Potassium 4.3 3.5 - 5.1 mmol/L   Chloride 101 98 - 111 mmol/L   CO2 26 22 - 32 mmol/L   Glucose, Bld 111 (H) 70 - 99 mg/dL    Comment: Glucose reference range applies only to samples taken after fasting for at least 8 hours.   BUN 12 6 - 20 mg/dL   Creatinine, Ser 0.79 0.44 - 1.00 mg/dL   Calcium 9.4 8.9 - 10.3 mg/dL   Total Protein 8.0 6.5 - 8.1 g/dL   Albumin 4.4 3.5 - 5.0 g/dL   AST 16 15 - 41 U/L   ALT 21 0 - 44 U/L   Alkaline Phosphatase 108 38 - 126 U/L   Total Bilirubin 0.5 0.3 - 1.2 mg/dL   GFR calc non Af Amer >60 >60 mL/min   GFR calc Af Amer >60 >60 mL/min   Anion gap 11 5 - 15    Comment: Performed at [facility], [place] [address], [place], [place] [address]  CBC     Status: Abnormal   Collection Time: 09/11/19 10:20 PM  Result Value Ref Range   WBC 18.0 (H) 4.0 - 10.5 K/uL   RBC 4.82 3.87 - 5.11 MIL/uL   Hemoglobin 13.6 12.0 - 15.0 g/dL   HCT 41.8 36.0 - 46.0 %   MCV 86.7 80.0 - 100.0 fL   MCH 28.2 26.0 - 34.0 pg   MCHC 32.5 30.0 - 36.0 g/dL   RDW 12.7 11.5 - 15.5 %   Platelets 298 150 - 400 K/uL   nRBC 0.0 0.0 - 0.2 %    Comment: Performed at [facility], [place] [address], [place], [place] [address]  I-Stat beta hCG blood, ED     Status: None   Collection Time: 09/11/19 10:24 PM  Result Value Ref Range   I-stat hCG, quantitative <5.0 <5 mIU/mL   Comment 3            Comment:   GEST. AGE      CONC.  (mIU/mL)   <=1 WEEK        5 - 50     2 WEEKS       50 - 500     3 WEEKS       100 - 10,000     4 WEEKS     1,000 - 30,000        FEMALE AND NON-PREGNANT FEMALE:     LESS THAN 5 mIU/mL   SARS Coronavirus 2 by  RT PCR (hospital order, performed in [place] hospital lab) Nasopharyngeal Nasopharyngeal Swab     Status: None   Collection Time: 09/12/19  1:30 AM   Specimen: Nasopharyngeal Swab  Result Value Ref Range   SARS Coronavirus 2 NEGATIVE NEGATIVE    Comment: (NOTE) SARS-CoV-2 target nucleic acids are NOT DETECTED. The SARS-CoV-2 RNA is generally detectable in upper and lower respiratory specimens during the acute phase of infection. The lowest concentration of SARS-CoV-2 viral copies this assay can detect is 250 copies / mL. A negative result does not preclude SARS-CoV-2 infection and should not be used as the sole basis for treatment or other patient management decisions.  A negative result may occur with improper specimen collection / handling, submission of specimen other than nasopharyngeal swab, presence of viral mutation(s) within the areas targeted by this assay, and inadequate number of viral copies (<250 copies /  mL). A negative result must be combined with clinical observations, patient history, and epidemiological information. Fact Sheet for Patients:   StrictlyIdeas.no Fact Sheet for Healthcare Providers: BankingDealers.co.za This test is not yet approved or cleared  by the [place] FDA and has been authorized for detection and/or diagnosis of SARS-CoV-2 by FDA under an Emergency Use Authorization (EUA).  This EUA will remain in effect (meaning this test can be used) for the duration of the COVID-19 declaration under Section 564(b)(1) of the Act, 21 U.S.C. section 360bbb-3(b)(1), unless the authorization is terminated or revoked sooner. Performed at [facility], [place] [address], [place], [place] [address]     CT ABDOMEN PELVIS W CONTRAST  Result Date: 09/12/2019 CLINICAL DATA:  Right lower quadrant abdominal pain EXAM: CT ABDOMEN AND PELVIS WITH CONTRAST TECHNIQUE: Multidetector CT imaging of the  abdomen and pelvis was performed using the standard protocol following bolus administration of intravenous contrast. CONTRAST:  139mL OMNIPAQUE IOHEXOL 300 MG/ML  SOLN COMPARISON:  None. FINDINGS: LOWER CHEST: Normal. HEPATOBILIARY: Normal hepatic contours. No intra- or extrahepatic biliary dilatation. Normal gallbladder PANCREAS: Normal pancreas. No ductal dilatation or peripancreatic fluid collection. SPLEEN: Normal. ADRENALS/URINARY TRACT: The adrenal glands are normal. No hydronephrosis, nephroureterolithiasis or solid renal mass. The urinary bladder is normal for degree of distention STOMACH/BOWEL: There is no hiatal hernia. Normal duodenal course and caliber. No small bowel dilatation or inflammation. No focal colonic abnormality. Appendix location: Pelvic (5 o'clock) Diameter: 11 mm Appendicolith: Yes, sagittal image 81 Mucosal hyper-enhancement: Minimal Extraluminal gas: None Periappendiceal collection: None VASCULAR/LYMPHATIC: Normal course and caliber of the major abdominal vessels. No abdominal or pelvic lymphadenopathy. REPRODUCTIVE: There bilateral dermoid cysts. The left measures 8.2 x 6.6 cm and the right measures 2.4 x 1.4 cm. MUSCULOSKELETAL. No bony spinal canal stenosis or focal osseous abnormality. OTHER: None. IMPRESSION: 1. Dilated appendix with mild mucosal hyper enhancement, consistent with acute appendicitis. No evidence of perforation or abscess formation. 2. Bilateral dermoid cysts, measuring up to 8.2 cm on the left and 2.4 cm on the right. Electronically Signed   By: Ulyses Jarred M.D.   On: 09/12/2019 01:17     A/P: Linda Shea is an 24 y.o. female with acute appendicitis.  I have recommended laparoscopic appendectomy.  Risks include bleeding, staple line leak, wound infection, hernia and damage to adjacent structures.   I believe she understands this and wishes to proceed with surgery.  Rosario Adie, MD  [facility]

## 2019-09-13 MED ORDER — HYDROCODONE-ACETAMINOPHEN 5-325 MG PO TABS
1.0000 | ORAL_TABLET | ORAL | Status: DC | PRN
  Administered 2019-09-13: 1 via ORAL
  Filled 2019-09-13: qty 1

## 2019-09-13 NOTE — Discharge Summary (Signed)
Physician Discharge Summary  Patient ID: Isolde Urdaneta MRN: [id number] DOB/AGE: [birth year] 24 y.o.  Admit date: 09/11/2019 Discharge date: 09/13/2019  Admission Diagnoses: Appendicitis  Discharge Diagnoses:  Active Problems:   Appendicitis   Discharged Condition: good  Hospital Course: Patient admitted overnight after surgery.  Her diet was advanced as tolerated.  By POD 1 she was recovering well and felt to be in stable condition for discharge to home.  Consults: None  Significant Diagnostic Studies: labs: cbc, bmet, CT  Treatments: IV hydration, analgesia: Morphine and surgery: lap appendectomy  Discharge Exam: Blood pressure (!) 116/55, pulse 65, temperature (!) 97.1 F (36.2 C), resp. rate 18, height 5\' 11"  (1.803 m), weight 90.7 kg, SpO2 99 %. General appearance: alert and cooperative GI: normal findings: soft, non-tender Incision/Wound: clean, dry, intact  Disposition: Discharge disposition: 01-Home or [facility]. Schedule an appointment as soon as possible for a visit in 2 week(s).   Specialty: General Surgery Contact information: [address] [place] [phone]          Signed: Rosario Adie [id number], 7:44 AM

## 2019-09-13 NOTE — Plan of Care (Signed)
  Problem: Clinical Measurements: Goal: Respiratory complications will improve Outcome: Progressing   Problem: Coping: Goal: Level of anxiety will decrease Outcome: Progressing   Problem: Elimination: Goal: Will not experience complications related to bowel motility Outcome: Progressing   Problem: Pain Managment: Goal: General experience of comfort will improve Outcome: Progressing

## 2019-09-13 NOTE — Plan of Care (Signed)
Pt stable at this time. Pt to d/c home today.

## 2019-09-14 LAB — NASOPHARYNGEAL CULTURE
Culture: NORMAL
Special Requests: NORMAL

## 2019-09-15 HISTORY — PX: APPENDECTOMY: SHX54

## 2019-09-15 LAB — SURGICAL PATHOLOGY

## 2019-09-16 DIAGNOSIS — Z6831 Body mass index (BMI) 31.0-31.9, adult: Secondary | ICD-10-CM | POA: Diagnosis not present

## 2019-09-16 DIAGNOSIS — N83209 Unspecified ovarian cyst, unspecified side: Secondary | ICD-10-CM | POA: Diagnosis not present

## 2019-09-16 DIAGNOSIS — N939 Abnormal uterine and vaginal bleeding, unspecified: Secondary | ICD-10-CM | POA: Diagnosis not present

## 2019-09-16 DIAGNOSIS — Z01419 Encounter for gynecological examination (general) (routine) without abnormal findings: Secondary | ICD-10-CM | POA: Diagnosis not present

## 2019-09-22 DIAGNOSIS — F3181 Bipolar II disorder: Secondary | ICD-10-CM | POA: Diagnosis not present

## 2019-09-22 DIAGNOSIS — F411 Generalized anxiety disorder: Secondary | ICD-10-CM | POA: Diagnosis not present

## 2019-09-22 DIAGNOSIS — F902 Attention-deficit hyperactivity disorder, combined type: Secondary | ICD-10-CM | POA: Diagnosis not present

## 2019-10-05 DIAGNOSIS — Z309 Encounter for contraceptive management, unspecified: Secondary | ICD-10-CM | POA: Diagnosis not present

## 2019-10-05 DIAGNOSIS — E282 Polycystic ovarian syndrome: Secondary | ICD-10-CM | POA: Diagnosis not present

## 2019-10-05 DIAGNOSIS — N83209 Unspecified ovarian cyst, unspecified side: Secondary | ICD-10-CM | POA: Diagnosis not present

## 2020-03-17 ENCOUNTER — Other Ambulatory Visit: Payer: Self-pay

## 2020-03-17 ENCOUNTER — Encounter (HOSPITAL_BASED_OUTPATIENT_CLINIC_OR_DEPARTMENT_OTHER): Payer: Self-pay | Admitting: Obstetrics and Gynecology

## 2020-03-17 NOTE — H&P (Signed)
Linda Shea is an 24 y.o. female presenting with incidentally found bilateral dermoids now symptomatic. She underwent an appendectomy and bilateral dermoids were noted. Follow up ultrasounds were done and showed stable bilateral dermoid cysts. HOwever, patient became symptomatic from left cyst and surgery was requested. She was placed on sprintec for cyst prevention in the future and will start this postop.   Pertinent Gynecological History: Menses: flow is moderate Bleeding: n/a Contraception: abstinence DES exposure: denies Blood transfusions: none Sexually transmitted diseases: no past history Previous GYN Procedures: none  Last pap: normal Date: 2021 OB History: G0, P0   Menstrual History: Patient's last menstrual period was 10/26/2019.    Past Medical History:  Diagnosis Date  . ADHD   . Bipolar disorder ([place])    bipolar 2  . Eczema    right hand uses lotion prn  . Ehlers-Danlos disease    joint pain and dislocation  . History of anemia 2019  . Hyperlipidemia   . Ovarian cyst    x 2  . PCOS (polycystic ovarian syndrome) dx june 2021    Past Surgical History:  Procedure Laterality Date  . KNEE SURGERY Right    2017 arthroscopic  . LAPAROSCOPIC APPENDECTOMY N/A 09/12/2019   Procedure: APPENDECTOMY LAPAROSCOPIC;  Surgeon: Leighton Ruff, MD;  Location: [facility];  Service: General;  Laterality: N/A;  . WISDOM TOOTH EXTRACTION  2018    Family History  Problem Relation Age of Onset  . Hypertension Other   . Diabetes Other     Social History:  reports that she has never smoked. She has never used smokeless tobacco. She reports current alcohol use. She reports that she does not use drugs.  Allergies: No Known Allergies  No medications prior to admission.    Review of Systems  Height 5\' 10"  (1.778 m), weight 98.9 kg, last menstrual period 10/26/2019. Physical Exam  Gen: well appearing, NAD CV: Reg rate Pulm: NWOB Abd: soft, nondistended, nontender, no  masses GYN: uterus 6 week size, no adnexa ttp/CMT Ext: No edema b/l   No results found for this or any previous visit (from the past 24 hour(s)).  No results found. CT scan 09/12/19: Acute appendicitis which she had removed at that time. Bilateral dermoids - L 8.2cm and R 2.4cm. Op report photos viewed and no torsion evident.   TVUS 10/05/19: right 2.4cm dermoid. Left 7.3cm likely dermoid. BF to both ovaries present.   TVUS 12/16/2019: R 2.3cm likely dermoid. L 8.5cm dermoid. BF to both ovaries seen. no symptoms.   TVUS 01/29/20: essentially exactly the same bilaterally  TVUS 03/16/20: R 2.7cm dermoid, L 9.5 x7.1x8.7cm dermoid  Assessment/Plan: 23 yo with ovarian cysts presenting for surgical management. Counseled in the office regarding her options and desires surgery. Plan is for Mini laparotomy, left ovarian cystectomy, pelvic washings, possible left salpingo-ophorectomy, possible right ovarian cystectomy, and possible cystoscopy. Risks discussed including infection, bleeding, damage to surrounding structures, need for additional procedures, postoperative DVT and recurrent cysts. All questions answered. Consent signed in the office. 2g ancef on call to OR.    Tyson Dense 03/17/2020, 12:41 PM

## 2020-03-17 NOTE — Progress Notes (Signed)
Spoke w/ via phone for pre-op interview---pt Lab needs dos----  Urine poct             Lab results------none COVID test ------03-18-2020 1410 pm Arrive at -------530 am 03-21-2020 NPO after MN NO Solid Food.  Clear liquids from MN until---430 am then npo Medications to take morning of surgery -----none Diabetic medication -----n/a Patient Special Instructions -----none Pre-Op special Istructions -----none Patient verbalized understanding of instructions that were given at this phone interview. Patient denies shortness of breath, chest pain, fever, cough at this phone interview.

## 2020-03-18 ENCOUNTER — Other Ambulatory Visit (HOSPITAL_COMMUNITY)
Admission: RE | Admit: 2020-03-18 | Discharge: 2020-03-18 | Disposition: A | Discharge status: Home / Self Care | Payer: BC Managed Care – PPO | Source: Ambulatory Visit | Attending: Obstetrics and Gynecology | Admitting: Obstetrics and Gynecology

## 2020-03-18 DIAGNOSIS — Z01812 Encounter for preprocedural laboratory examination: Secondary | ICD-10-CM | POA: Insufficient documentation

## 2020-03-18 DIAGNOSIS — Z20822 Contact with and (suspected) exposure to covid-19: Secondary | ICD-10-CM | POA: Insufficient documentation

## 2020-03-19 LAB — SARS CORONAVIRUS 2 (TAT 6-24 HRS): SARS Coronavirus 2: NEGATIVE

## 2020-03-21 ENCOUNTER — Inpatient Hospital Stay (HOSPITAL_BASED_OUTPATIENT_CLINIC_OR_DEPARTMENT_OTHER): Payer: BC Managed Care – PPO | Admitting: Anesthesiology

## 2020-03-21 ENCOUNTER — Encounter (HOSPITAL_BASED_OUTPATIENT_CLINIC_OR_DEPARTMENT_OTHER): Payer: Self-pay | Admitting: Obstetrics and Gynecology

## 2020-03-21 ENCOUNTER — Other Ambulatory Visit: Payer: Self-pay

## 2020-03-21 ENCOUNTER — Inpatient Hospital Stay (HOSPITAL_BASED_OUTPATIENT_CLINIC_OR_DEPARTMENT_OTHER)
Admission: AD | Admit: 2020-03-21 | Discharge: 2020-03-21 | DRG: 743 | Disposition: A | Discharge status: Home / Self Care | Payer: BC Managed Care – PPO | Attending: Obstetrics and Gynecology | Admitting: Obstetrics and Gynecology

## 2020-03-21 ENCOUNTER — Encounter (HOSPITAL_BASED_OUTPATIENT_CLINIC_OR_DEPARTMENT_OTHER)
Admission: AD | Disposition: A | Discharge status: Home / Self Care | Payer: Self-pay | Source: Home / Self Care | Attending: Obstetrics and Gynecology

## 2020-03-21 DIAGNOSIS — D270 Benign neoplasm of right ovary: Secondary | ICD-10-CM | POA: Diagnosis present

## 2020-03-21 DIAGNOSIS — D271 Benign neoplasm of left ovary: Secondary | ICD-10-CM | POA: Diagnosis present

## 2020-03-21 DIAGNOSIS — E282 Polycystic ovarian syndrome: Secondary | ICD-10-CM | POA: Diagnosis present

## 2020-03-21 DIAGNOSIS — E785 Hyperlipidemia, unspecified: Secondary | ICD-10-CM | POA: Diagnosis present

## 2020-03-21 DIAGNOSIS — Z20822 Contact with and (suspected) exposure to covid-19: Secondary | ICD-10-CM | POA: Diagnosis present

## 2020-03-21 DIAGNOSIS — Z833 Family history of diabetes mellitus: Secondary | ICD-10-CM | POA: Diagnosis not present

## 2020-03-21 DIAGNOSIS — N83209 Unspecified ovarian cyst, unspecified side: Secondary | ICD-10-CM | POA: Diagnosis present

## 2020-03-21 DIAGNOSIS — Z8249 Family history of ischemic heart disease and other diseases of the circulatory system: Secondary | ICD-10-CM

## 2020-03-21 DIAGNOSIS — N83202 Unspecified ovarian cyst, left side: Secondary | ICD-10-CM | POA: Diagnosis present

## 2020-03-21 HISTORY — DX: Bipolar disorder, unspecified: F31.9

## 2020-03-21 HISTORY — DX: Attention-deficit hyperactivity disorder, unspecified type: F90.9

## 2020-03-21 HISTORY — DX: Depression, unspecified: F32.A

## 2020-03-21 HISTORY — DX: Unspecified ovarian cyst, unspecified side: N83.209

## 2020-03-21 HISTORY — PX: LAPAROSCOPIC OVARIAN CYSTECTOMY: SHX6248

## 2020-03-21 HISTORY — PX: LAPAROTOMY: SHX154

## 2020-03-21 HISTORY — DX: Dermatitis, unspecified: L30.9

## 2020-03-21 HISTORY — DX: Hyperlipidemia, unspecified: E78.5

## 2020-03-21 HISTORY — DX: Polycystic ovarian syndrome: E28.2

## 2020-03-21 HISTORY — DX: Anxiety disorder, unspecified: F41.9

## 2020-03-21 LAB — TYPE AND SCREEN
ABO/RH(D): A POS
Antibody Screen: NEGATIVE

## 2020-03-21 LAB — POCT I-STAT, CHEM 8
BUN: 16 mg/dL (ref 6–20)
Calcium, Ion: 1.27 mmol/L (ref 1.15–1.40)
Chloride: 104 mmol/L (ref 98–111)
Creatinine, Ser: 0.6 mg/dL (ref 0.44–1.00)
Glucose, Bld: 99 mg/dL (ref 70–99)
HCT: 40 % (ref 36.0–46.0)
Hemoglobin: 13.6 g/dL (ref 12.0–15.0)
Potassium: 4.8 mmol/L (ref 3.5–5.1)
Sodium: 140 mmol/L (ref 135–145)
TCO2: 25 mmol/L (ref 22–32)

## 2020-03-21 LAB — POCT PREGNANCY, URINE: Preg Test, Ur: NEGATIVE

## 2020-03-21 LAB — ABO/RH: ABO/RH(D): A POS

## 2020-03-21 SURGERY — LAPAROTOMY
Anesthesia: General | Site: Abdomen

## 2020-03-21 MED ORDER — SCOPOLAMINE 1 MG/3DAYS TD PT72
1.0000 | MEDICATED_PATCH | TRANSDERMAL | Status: DC
  Administered 2020-03-21: 1.5 mg via TRANSDERMAL

## 2020-03-21 MED ORDER — ROCURONIUM BROMIDE 10 MG/ML (PF) SYRINGE
PREFILLED_SYRINGE | INTRAVENOUS | Status: DC | PRN
  Administered 2020-03-21: 100 mg via INTRAVENOUS

## 2020-03-21 MED ORDER — LACTATED RINGERS IV SOLN: INTRAVENOUS | Status: DC

## 2020-03-21 MED ORDER — FENTANYL CITRATE (PF) 100 MCG/2ML IJ SOLN
INTRAMUSCULAR | Status: AC
  Filled 2020-03-21: qty 2

## 2020-03-21 MED ORDER — LIDOCAINE 2% (20 MG/ML) 5 ML SYRINGE
INTRAMUSCULAR | Status: DC | PRN
  Administered 2020-03-21: 60 mg via INTRAVENOUS

## 2020-03-21 MED ORDER — KETAMINE HCL 10 MG/ML IJ SOLN
INTRAMUSCULAR | Status: AC
  Filled 2020-03-21: qty 1

## 2020-03-21 MED ORDER — DEXAMETHASONE SODIUM PHOSPHATE 10 MG/ML IJ SOLN
INTRAMUSCULAR | Status: AC
  Filled 2020-03-21: qty 1

## 2020-03-21 MED ORDER — OXYCODONE HCL 5 MG PO TABS
ORAL_TABLET | ORAL | Status: AC
  Filled 2020-03-21: qty 2

## 2020-03-21 MED ORDER — HYDROMORPHONE HCL 1 MG/ML IJ SOLN
INTRAMUSCULAR | Status: AC
  Filled 2020-03-21: qty 1

## 2020-03-21 MED ORDER — BUPIVACAINE HCL (PF) 0.25 % IJ SOLN
INTRAMUSCULAR | Status: DC | PRN
  Administered 2020-03-21: 8 mL

## 2020-03-21 MED ORDER — FENTANYL CITRATE (PF) 250 MCG/5ML IJ SOLN
INTRAMUSCULAR | Status: AC
  Filled 2020-03-21: qty 5

## 2020-03-21 MED ORDER — SOD CITRATE-CITRIC ACID 500-334 MG/5ML PO SOLN: 30.0000 mL | ORAL | Status: DC

## 2020-03-21 MED ORDER — OXYCODONE HCL 5 MG PO TABS
10.0000 mg | ORAL_TABLET | Freq: Once | ORAL | Status: AC
  Administered 2020-03-21: 10 mg via ORAL

## 2020-03-21 MED ORDER — ACETAMINOPHEN 500 MG PO TABS
1000.0000 mg | ORAL_TABLET | ORAL | Status: AC
  Administered 2020-03-21: 1000 mg via ORAL

## 2020-03-21 MED ORDER — SUGAMMADEX SODIUM 200 MG/2ML IV SOLN
INTRAVENOUS | Status: DC | PRN
  Administered 2020-03-21: 200 mg via INTRAVENOUS

## 2020-03-21 MED ORDER — OXYCODONE HCL 5 MG PO TABS: 5.0000 mg | ORAL_TABLET | Freq: Four times a day (QID) | ORAL | 0 refills | Status: AC | PRN

## 2020-03-21 MED ORDER — MIDAZOLAM HCL 2 MG/2ML IJ SOLN
INTRAMUSCULAR | Status: AC
  Filled 2020-03-21: qty 2

## 2020-03-21 MED ORDER — ONDANSETRON HCL 4 MG/2ML IJ SOLN
INTRAMUSCULAR | Status: DC | PRN
  Administered 2020-03-21: 4 mg via INTRAVENOUS

## 2020-03-21 MED ORDER — FENTANYL CITRATE (PF) 100 MCG/2ML IJ SOLN
25.0000 ug | INTRAMUSCULAR | Status: DC | PRN
  Administered 2020-03-21 (×2): 50 ug via INTRAVENOUS

## 2020-03-21 MED ORDER — PROPOFOL 10 MG/ML IV BOLUS
INTRAVENOUS | Status: AC
  Filled 2020-03-21: qty 40

## 2020-03-21 MED ORDER — DOCUSATE SODIUM 100 MG PO CAPS: 100.0000 mg | ORAL_CAPSULE | Freq: Two times a day (BID) | ORAL | 2 refills | Status: AC

## 2020-03-21 MED ORDER — ROCURONIUM BROMIDE 10 MG/ML (PF) SYRINGE
PREFILLED_SYRINGE | INTRAVENOUS | Status: AC
  Filled 2020-03-21: qty 10

## 2020-03-21 MED ORDER — IBUPROFEN 800 MG PO TABS: 800.0000 mg | ORAL_TABLET | Freq: Three times a day (TID) | ORAL | 0 refills | Status: AC | PRN

## 2020-03-21 MED ORDER — CEFAZOLIN SODIUM-DEXTROSE 2-4 GM/100ML-% IV SOLN
2.0000 g | INTRAVENOUS | Status: AC
  Administered 2020-03-21: 2 g via INTRAVENOUS

## 2020-03-21 MED ORDER — KETAMINE HCL 10 MG/ML IJ SOLN
INTRAMUSCULAR | Status: DC | PRN
  Administered 2020-03-21: 30 mg via INTRAVENOUS
  Administered 2020-03-21: 20 mg via INTRAVENOUS

## 2020-03-21 MED ORDER — LIDOCAINE HCL (PF) 2 % IJ SOLN
INTRAMUSCULAR | Status: AC
  Filled 2020-03-21: qty 5

## 2020-03-21 MED ORDER — CEFAZOLIN SODIUM-DEXTROSE 2-4 GM/100ML-% IV SOLN
INTRAVENOUS | Status: AC
  Filled 2020-03-21: qty 100

## 2020-03-21 MED ORDER — HYDROMORPHONE HCL 1 MG/ML IJ SOLN
0.2500 mg | INTRAMUSCULAR | Status: DC | PRN
  Administered 2020-03-21 (×6): 0.5 mg via INTRAVENOUS

## 2020-03-21 MED ORDER — SCOPOLAMINE 1 MG/3DAYS TD PT72
MEDICATED_PATCH | TRANSDERMAL | Status: AC
  Filled 2020-03-21: qty 1

## 2020-03-21 MED ORDER — FENTANYL CITRATE (PF) 250 MCG/5ML IJ SOLN
INTRAMUSCULAR | Status: DC | PRN
  Administered 2020-03-21 (×2): 50 ug via INTRAVENOUS
  Administered 2020-03-21: 100 ug via INTRAVENOUS
  Administered 2020-03-21: 50 ug via INTRAVENOUS

## 2020-03-21 MED ORDER — POVIDONE-IODINE 10 % EX SWAB: 2.0000 "application " | Freq: Once | CUTANEOUS | Status: DC

## 2020-03-21 MED ORDER — MIDAZOLAM HCL 5 MG/5ML IJ SOLN
INTRAMUSCULAR | Status: DC | PRN
  Administered 2020-03-21: 2 mg via INTRAVENOUS

## 2020-03-21 MED ORDER — ONDANSETRON HCL 4 MG/2ML IJ SOLN
INTRAMUSCULAR | Status: AC
  Filled 2020-03-21: qty 2

## 2020-03-21 MED ORDER — DEXAMETHASONE SODIUM PHOSPHATE 10 MG/ML IJ SOLN
INTRAMUSCULAR | Status: DC | PRN
  Administered 2020-03-21: 10 mg via INTRAVENOUS

## 2020-03-21 MED ORDER — PROPOFOL 10 MG/ML IV BOLUS
INTRAVENOUS | Status: DC | PRN
  Administered 2020-03-21: 150 mg via INTRAVENOUS

## 2020-03-21 MED ORDER — ACETAMINOPHEN 500 MG PO TABS
ORAL_TABLET | ORAL | Status: AC
  Filled 2020-03-21: qty 2

## 2020-03-21 SURGICAL SUPPLY — 72 items
ADH SKN CLS APL DERMABOND .7 (GAUZE/BANDAGES/DRESSINGS) ×3
APL SKNCLS STERI-STRIP NONHPOA (GAUZE/BANDAGES/DRESSINGS) ×3
BAG RETRIEVAL 10 (BASKET)
BENZOIN TINCTURE PRP APPL 2/3 (GAUZE/BANDAGES/DRESSINGS) ×4 IMPLANT
CABLE HIGH FREQUENCY MONO STRZ (ELECTRODE) IMPLANT
CANISTER SUCT 3000ML PPV (MISCELLANEOUS) ×4 IMPLANT
CATH ROBINSON RED A/P 16FR (CATHETERS) IMPLANT
CELLS DAT CNTRL 66122 CELL SVR (MISCELLANEOUS) ×3 IMPLANT
COVER MAYO STAND STRL (DRAPES) ×4 IMPLANT
COVER WAND RF STERILE (DRAPES) ×4 IMPLANT
DERMABOND ADVANCED (GAUZE/BANDAGES/DRESSINGS) ×1
DERMABOND ADVANCED .7 DNX12 (GAUZE/BANDAGES/DRESSINGS) ×3 IMPLANT
DRAPE WARM FLUID 44X44 (DRAPES) IMPLANT
DRSG OPSITE POSTOP 3X4 (GAUZE/BANDAGES/DRESSINGS) IMPLANT
DRSG OPSITE POSTOP 4X10 (GAUZE/BANDAGES/DRESSINGS) ×4 IMPLANT
DURAPREP 26ML APPLICATOR (WOUND CARE) ×4 IMPLANT
GAUZE 4X4 16PLY RFD (DISPOSABLE) ×4 IMPLANT
GLOVE BIO SURGEON STRL SZ 6 (GLOVE) ×4 IMPLANT
GLOVE BIO SURGEON STRL SZ 6.5 (GLOVE) ×8 IMPLANT
GLOVE BIOGEL PI IND STRL 6 (GLOVE) ×3 IMPLANT
GLOVE BIOGEL PI IND STRL 6.5 (GLOVE) ×3 IMPLANT
GLOVE BIOGEL PI IND STRL 7.0 (GLOVE) ×9 IMPLANT
GLOVE BIOGEL PI IND STRL 7.5 (GLOVE) ×9 IMPLANT
GLOVE BIOGEL PI INDICATOR 6 (GLOVE) ×1
GLOVE BIOGEL PI INDICATOR 6.5 (GLOVE) ×1
GLOVE BIOGEL PI INDICATOR 7.0 (GLOVE) ×3
GLOVE BIOGEL PI INDICATOR 7.5 (GLOVE) ×3
GOWN STRL REUS W/ TWL LRG LVL3 (GOWN DISPOSABLE) ×15 IMPLANT
GOWN STRL REUS W/TWL LRG LVL3 (GOWN DISPOSABLE) ×28 IMPLANT
HIBICLENS CHG 4% 4OZ (MISCELLANEOUS) ×4 IMPLANT
IV NS 1000ML (IV SOLUTION)
IV NS 1000ML BAXH (IV SOLUTION) IMPLANT
KIT TURNOVER CYSTO (KITS) ×4 IMPLANT
LIGASURE IMPACT 36 18CM CVD LR (INSTRUMENTS) IMPLANT
LIGASURE LAP L-HOOKWIRE 5 44CM (INSTRUMENTS) IMPLANT
NEEDLE HYPO 25X1 1.5 SAFETY (NEEDLE) ×4 IMPLANT
NEEDLE INSUFFLATION 120MM (ENDOMECHANICALS) ×4 IMPLANT
NS IRRIG 1000ML POUR BTL (IV SOLUTION) ×12 IMPLANT
NS IRRIG 500ML POUR BTL (IV SOLUTION) ×4 IMPLANT
PACK ABDOMINAL GYN (CUSTOM PROCEDURE TRAY) ×4 IMPLANT
PACK LAPAROSCOPY BASIN (CUSTOM PROCEDURE TRAY) ×4 IMPLANT
PAD OB MATERNITY 4.3X12.25 (PERSONAL CARE ITEMS) ×4 IMPLANT
RTRCTR C-SECT PINK 25CM LRG (MISCELLANEOUS) IMPLANT
RTRCTR WOUND ALEXIS 18CM MED (MISCELLANEOUS) ×4
SCISSORS LAP 5X35 DISP (ENDOMECHANICALS) IMPLANT
SCISSORS LAP 5X45 EPIX DISP (ENDOMECHANICALS) IMPLANT
SET IRRIG Y TYPE TUR BLADDER L (SET/KITS/TRAYS/PACK) IMPLANT
SET SUCTION IRRIG HYDROSURG (IRRIGATION / IRRIGATOR) IMPLANT
SET TUBE SMOKE EVAC HIGH FLOW (TUBING) ×4 IMPLANT
SHEET LAVH (DRAPES) ×4 IMPLANT
SPONGE LAP 18X18 RF (DISPOSABLE) IMPLANT
STRIP CLOSURE SKIN 1/2X4 (GAUZE/BANDAGES/DRESSINGS) ×4 IMPLANT
SUT PLAIN 2 0 (SUTURE)
SUT PLAIN ABS 2-0 CT1 27XMFL (SUTURE) IMPLANT
SUT VIC AB 0 CT1 18XCR BRD8 (SUTURE) IMPLANT
SUT VIC AB 0 CT1 36 (SUTURE) ×4 IMPLANT
SUT VIC AB 0 CT1 8-18 (SUTURE)
SUT VIC AB 4-0 PS2 18 (SUTURE) IMPLANT
SUT VIC AB 4-0 SH 27 (SUTURE) ×12
SUT VIC AB 4-0 SH 27XANBCTRL (SUTURE) ×9 IMPLANT
SUT VICRYL 0 TIES 12 18 (SUTURE) IMPLANT
SUT VICRYL 0 UR6 27IN ABS (SUTURE) ×4 IMPLANT
SUT VICRYL RAPIDE 4/0 PS 2 (SUTURE) ×8 IMPLANT
SYR 50ML LL SCALE MARK (SYRINGE) IMPLANT
SYS BAG RETRIEVAL 10MM (BASKET)
SYSTEM BAG RETRIEVAL 10MM (BASKET) IMPLANT
TOWEL OR 17X26 10 PK STRL BLUE (TOWEL DISPOSABLE) ×4 IMPLANT
TRAY FOLEY W/BAG SLVR 14FR LF (SET/KITS/TRAYS/PACK) ×4 IMPLANT
TROCAR BLADELESS OPT 5 100 (ENDOMECHANICALS) ×4 IMPLANT
TROCAR XCEL NON-BLD 11X100MML (ENDOMECHANICALS) IMPLANT
WARMER LAPAROSCOPE (MISCELLANEOUS) ×4 IMPLANT
WATER STERILE IRR 500ML POUR (IV SOLUTION) IMPLANT

## 2020-03-21 NOTE — Anesthesia Procedure Notes (Signed)
Procedure Name: Intubation Date/Time: 03/21/2020 7:38 AM Performed by: Talbot Grumbling, CRNA Pre-anesthesia Checklist: Patient identified, Emergency Drugs available, Suction available and Patient being monitored Patient Re-evaluated:Patient Re-evaluated prior to induction Oxygen Delivery Method: Circle system utilized Preoxygenation: Pre-oxygenation with 100% oxygen Induction Type: IV induction Ventilation: Mask ventilation without difficulty Laryngoscope Size: Mac and 3 Grade View: Grade I Tube type: Oral Tube size: 7.0 mm Number of attempts: 1 Airway Equipment and Method: Stylet Placement Confirmation: ETT inserted through vocal cords under direct vision,  positive ETCO2 and breath sounds checked- equal and bilateral Secured at: 21 cm Tube secured with: Tape Dental Injury: Teeth and Oropharynx as per pre-operative assessment

## 2020-03-21 NOTE — Op Note (Signed)
DATE OF OPERATION:  12/15/15 PREOPERATIVE DIAGNOSES: 1. Large left sided ovarian dermoid 2. Small right ovarian dermoid  POSTOPERATIVE DIAGNOSIS: 1. Same  OPERATION PERFORMED: Minilaparotomy with left ovarian cystectomy, incision and drainage of right ovarian cyst, pelvic washings  SURGEON:  Lucillie Garfinkel, MD  ASSISTANT: Linda Hedges, DO  ANESTHESIA:  General.  OPERATIVE FINDINGS: 1.  Large left sided ovarian cyst with large dermoid 2. Right ovary PCOS appearance and small dermoid  SPECIMEN SENT: Intact left cyst and pelvic washings  ESTIMATED BLOOD LOSS: 100cc  INDICATIONS FOR OPERATION: All risks and benefits of the surgery had been discussed with the patient in clinic. All her questions were answered. The patient was taken to the operating room in stable condition.  DESCRIPTION OF OPERATION: The patient was taken to the OR where she was placed under general anesthesia without difficulty. She was then prepped and draped in the usual sterile fashion and placed in the dorsal lithotomy position. A preoperative bimanual examination revealed findings as above. A 5cm mini lap incision was made and carried down to the underlying fascia. Fascia was incised in the midline and extended bilaterally with Butler scissors. Underlying rectus muscles were separated from the fascia superiorly and inferiorly in the usual fashion. Peritoneum was entered sharply with hemostat and metz with good visualization of the bladder and bowel. Once inside the abdominal cavity, a self-retaining retractor was placed. Above intraoperative findings noted.  Pelvic washings performed. An incision was made in the ovary at the area of the presumed cyst. Hemostat was used to shell out cyst with no minimal spillage. The cyst was removed completely. Ovary was noted to be hemostatic The ovary was closed with 4'0 vicryl.  Attention turned to Right ovary in area of presumed cyst. Incision made and fluid leakage noted that was  not spilled into rest of abdomen. Was suctioned out completely. Cyst wall unable to be identified and ovary closed with good hemostasis in 2 interrupted 4'0 vicryl stitches. The lap sponges were then removed and the self-retaining retractor was removed. The patient tolerated the operation nicely. There were no complications associated with this surgical procedure to this point. The sponge count was correct times 2 at this time. The bladder was drained with a red rubber for 100cc of clear but concentrated urine.   Having removed all instruments and packs, we then began closure of the abdomen. The peritoneum was closed with #2-0 Vicryl in a running continuous manner. The fascia was closed with #0 Vicryl in a running continuous manner.  Hemostasis was secured throughout the entire layers.  The incision was then closed as noted in the above operative findings. Local 8cc given in area of incision.The patient tolerated the operation nicely and was then taken to the Recovery Room in good condition.     Lucillie Garfinkel MD

## 2020-03-21 NOTE — Discharge Instructions (Addendum)
[place] Instructions  You may begin taking Ibuprofen at any time.  Begin taking Tylenol(Acetaminophen) after 12:50 pm today.  Activity: Get plenty of rest for the remainder of the day. A responsible adult should stay with you for 24 hours following the procedure.  For the next 24 hours, DO NOT: -Drive a car -Paediatric nurse -Drink alcoholic beverages -Take any medication unless instructed by your physician -Make any legal decisions or sign important papers.  Meals: Start with liquid foods such as gelatin or soup. Progress to regular foods as tolerated. Avoid greasy, spicy, heavy foods. If nausea and/or vomiting occur, drink only clear liquids until the nausea and/or vomiting subsides. Call your physician if vomiting continues.  Special Instructions/Symptoms: Your throat may feel dry or sore from the anesthesia or the breathing tube placed in your throat during surgery. If this causes discomfort, gargle with warm salt water. The discomfort should disappear within 24 hours.  If you had a scopolamine patch placed behind your ear for the management of post- operative nausea and/or vomiting:  1. The medication in the patch is effective for 72 hours, after which it should be removed.  Wrap patch in a tissue and discard in the trash. Wash hands thoroughly with soap and water. 2. You may remove the patch earlier than 72 hours if you experience unpleasant side effects which may include dry mouth, dizziness or visual disturbances. 3. Avoid touching the patch. Wash your hands with soap and water after contact with the patch.  DISCHARGE INSTRUCTIONS: Laparoscopy  The following instructions have been prepared to help you care for yourself upon your return home today.  Wound care: Marland Kitchen Do not get the incision wet for the first 24 hours. The incision should be kept clean and dry. . The honeycomb bandage may be removed in 2 days . Marland Kitchen Should the incision become sore, red, and swollen  after the first week, check with your doctor.  Personal hygiene: . Shower the day after your procedure.  Activity and limitations: . Do NOT drive or operate any equipment today. . Do NOT lift anything more than 15 pounds for 2-3 weeks after surgery. . Do NOT rest in bed all day. . Walking is encouraged. Walk each day, starting slowly with 5-minute walks 3 or 4 times a day. Slowly increase the length of your walks. . Walk up and down stairs slowly. . Do NOT do strenuous activities, such as golfing, playing tennis, bowling, running, biking, weight lifting, gardening, mowing, or vacuuming for 2-4 weeks. Ask your doctor when it is okay to start.  Diet: Eat a light meal as desired this evening. You may resume your usual diet tomorrow.  Return to work: This is dependent on the type of work you do. For the most part you can return to a desk job within a week of surgery. If you are more active at work, please discuss this with your doctor.  What to expect after your surgery: You may have a slight burning sensation when you urinate on the first day. You may have a very small amount of blood in the urine. Expect to have a small amount of vaginal discharge/light bleeding for 1-2 weeks. It is not unusual to have abdominal soreness and bruising for up to 2 weeks. You may be tired and need more rest for about 1 week. You may experience shoulder pain for 24-72 hours. Lying flat in bed may relieve it.  Call your doctor for any of the following: .  Develop a fever of 100.4 or greater . Inability to urinate 6 hours after discharge from hospital . Severe pain not relieved by pain medications . Persistent of heavy bleeding at incision site . Redness or swelling around incision site after a week . Increasing nausea or vomiting

## 2020-03-21 NOTE — Transfer of Care (Signed)
Immediate Anesthesia Transfer of Care Note  Patient: Linda Shea  Procedure(s) Performed: MINI LAPAROTOMY (N/A Abdomen) LEFT OVARIAN CYSTECTOMY, RIGHT DRAINAGE OF OVARIAN CYST, PELVIC WASHINGS (Bilateral Abdomen)  Patient Location: PACU  Anesthesia Type:General  Level of Consciousness: sedated  Airway & Oxygen Therapy: Patient Spontanous Breathing and Patient connected to face mask oxygen  Post-op Assessment: Report given to RN and Post -op Vital signs reviewed and stable  Post vital signs: Reviewed and stable  Last Vitals:  Vitals Value Taken Time  BP 131/71 03/21/20 0902  Temp    Pulse 63 03/21/20 0905  Resp 13 03/21/20 0905  SpO2 100 % 03/21/20 0905  Vitals shown include unvalidated device data.  Last Pain:  Vitals:   03/21/20 0637  TempSrc: Oral  PainSc: 3       Patients Stated Pain Goal: 6 (93/81/82 9937)  Complications: No complications documented.

## 2020-03-21 NOTE — Progress Notes (Signed)
No updates to above H&P. Patient arrived NPO and was consented in PACU. Risks again discussed, all questions answered, and consent signed. Proceed with above surgery.    Yoshua Geisinger MD  

## 2020-03-21 NOTE — Anesthesia Preprocedure Evaluation (Addendum)
Anesthesia Evaluation  Patient identified by MRN, date of birth, ID band Patient awake    Reviewed: Allergy & Precautions, NPO status , Patient's Chart, lab work & pertinent test results  Airway Mallampati: II  TM Distance: >3 FB Neck ROM: Full    Dental no notable dental hx. (+) Teeth Intact, Dental Advisory Given Bottom permanent retainer:   Pulmonary neg pulmonary ROS,    Pulmonary exam normal breath sounds clear to auscultation       Cardiovascular negative cardio ROS Normal cardiovascular exam Rhythm:Regular Rate:Normal     Neuro/Psych PSYCHIATRIC DISORDERS Anxiety Depression Bipolar Disorder negative neurological ROS     GI/Hepatic negative GI ROS, Neg liver ROS,   Endo/Other  negative endocrine ROSPCOS  Renal/GU negative Renal ROS  negative genitourinary   Musculoskeletal negative musculoskeletal ROS (+)   Abdominal   Peds  (+) ADHD Hematology negative hematology ROS (+)   Anesthesia Other Findings Ehlers-Danlos syndrome  Reproductive/Obstetrics                            Anesthesia Physical Anesthesia Plan  ASA: II  Anesthesia Plan: General   Post-op Pain Management:    Induction: Intravenous  PONV Risk Score and Plan: 3 and Midazolam, Dexamethasone, Ondansetron and Scopolamine patch - Pre-op  Airway Management Planned: Oral ETT  Additional Equipment:   Intra-op Plan:   Post-operative Plan: Extubation in OR  Informed Consent: I have reviewed the patients History and Physical, chart, labs and discussed the procedure including the risks, benefits and alternatives for the proposed anesthesia with the patient or authorized representative who has indicated his/her understanding and acceptance.     Dental advisory given  Plan Discussed with: CRNA  Anesthesia Plan Comments:         Anesthesia Quick Evaluation

## 2020-03-21 NOTE — Anesthesia Postprocedure Evaluation (Signed)
Anesthesia Post Note  Patient: [name]  Procedure(s) Performed: MINI LAPAROTOMY (N/A Abdomen) LEFT OVARIAN CYSTECTOMY, RIGHT DRAINAGE OF OVARIAN CYST, PELVIC WASHINGS (Bilateral Abdomen)     Patient location during evaluation: PACU Anesthesia Type: General Level of consciousness: awake and alert Pain management: pain level controlled Vital Signs Assessment: post-procedure vital signs reviewed and stable Respiratory status: spontaneous breathing, nonlabored ventilation, respiratory function stable and patient connected to nasal cannula oxygen Cardiovascular status: blood pressure returned to baseline and stable Postop Assessment: no apparent nausea or vomiting Anesthetic complications: no   No complications documented.  Last Vitals:  Vitals:   03/21/20 0945 03/21/20 1053  BP: (!) 144/90   Pulse: 87 (!) 104  Resp: 18 15  Temp:  37.1 C  SpO2: 100% 100%    Last Pain:  Vitals:   03/21/20 1105  TempSrc:   PainSc: 3                  Linda Shea

## 2020-03-22 ENCOUNTER — Encounter (HOSPITAL_BASED_OUTPATIENT_CLINIC_OR_DEPARTMENT_OTHER): Payer: Self-pay | Admitting: Obstetrics and Gynecology

## 2020-03-22 LAB — CYTOLOGY - NON PAP

## 2020-03-22 LAB — SURGICAL PATHOLOGY

## 2020-05-20 ENCOUNTER — Other Ambulatory Visit: Payer: Self-pay

## 2020-05-20 ENCOUNTER — Emergency Department (HOSPITAL_COMMUNITY)
Admission: EM | Admit: 2020-05-20 | Discharge: 2020-05-21 | Disposition: A | Discharge status: Home / Self Care | Payer: BC Managed Care – PPO | Attending: Emergency Medicine | Admitting: Emergency Medicine

## 2020-05-20 DIAGNOSIS — Z5321 Procedure and treatment not carried out due to patient leaving prior to being seen by health care provider: Secondary | ICD-10-CM | POA: Diagnosis not present

## 2020-05-20 DIAGNOSIS — R109 Unspecified abdominal pain: Secondary | ICD-10-CM | POA: Insufficient documentation

## 2020-05-20 DIAGNOSIS — G8918 Other acute postprocedural pain: Secondary | ICD-10-CM | POA: Diagnosis not present

## 2020-05-20 LAB — BASIC METABOLIC PANEL
Anion gap: 11 (ref 5–15)
BUN: 13 mg/dL (ref 6–20)
CO2: 24 mmol/L (ref 22–32)
Calcium: 9.3 mg/dL (ref 8.9–10.3)
Chloride: 104 mmol/L (ref 98–111)
Creatinine, Ser: 0.72 mg/dL (ref 0.44–1.00)
GFR, Estimated: >60 mL/min (ref >60)
Glucose, Bld: 86 mg/dL (ref 70–99)
Potassium: 3.7 mmol/L (ref 3.5–5.1)
Sodium: 139 mmol/L (ref 135–145)

## 2020-05-20 LAB — CBC
HCT: 38.1 % (ref 36.0–46.0)
Hemoglobin: 12.6 g/dL (ref 12.0–15.0)
MCH: 28.7 pg (ref 26.0–34.0)
MCHC: 33.1 g/dL (ref 30.0–36.0)
MCV: 86.8 fL (ref 80.0–100.0)
Platelets: 315 10*3/uL (ref 150–400)
RBC: 4.39 MIL/uL (ref 3.87–5.11)
RDW: 13.4 % (ref 11.5–15.5)
WBC: 10.1 10*3/uL (ref 4.0–10.5)
nRBC: 0 % (ref 0.0–0.2)

## 2020-05-20 LAB — URINALYSIS, ROUTINE W REFLEX MICROSCOPIC
Bilirubin Urine: NEGATIVE
Glucose, UA: NEGATIVE mg/dL
Hgb urine dipstick: NEGATIVE
Ketones, ur: NEGATIVE mg/dL
Leukocytes,Ua: NEGATIVE
Nitrite: NEGATIVE
Protein, ur: NEGATIVE mg/dL
Specific Gravity, Urine: 1.015 (ref 1.005–1.030)
pH: 6 (ref 5.0–8.0)

## 2020-05-20 NOTE — ED Triage Notes (Signed)
Pt presents to ED POV. Pt c./o pain on R flank. Pt reports that she had cyst removed from both ovaries in 12/6. PCP told pt to come to ED to ensure no complications or torsion.

## 2020-05-21 NOTE — ED Notes (Signed)
Pt left due to not being seen quick enough 

## 2021-01-01 IMAGING — CT CT ABD-PELV W/ CM
2 of 4 series · 16 of 46 positions shown, 18 images · IV contrast (omnipaque)
Comparison: None.

CLINICAL DATA: Right lower quadrant abdominal pain

EXAM:
CT ABDOMEN AND PELVIS WITH CONTRAST
TECHNIQUE: Multidetector CT imaging of the abdomen and pelvis was performed
using the standard protocol following bolus administration of
intravenous contrast.
CONTRAST:  100mL OMNIPAQUE IOHEXOL 300 MG/ML  SOLN

[Series 2: axial st · axial · 0.73mm/px · z∈[-490,-50]mm · 13 of 98 slices shown, 15 images]
[im 5/98  soft-tissue]
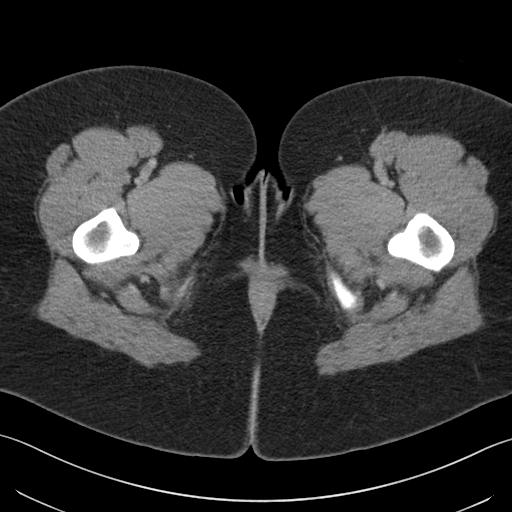
[im 5/98  bone]
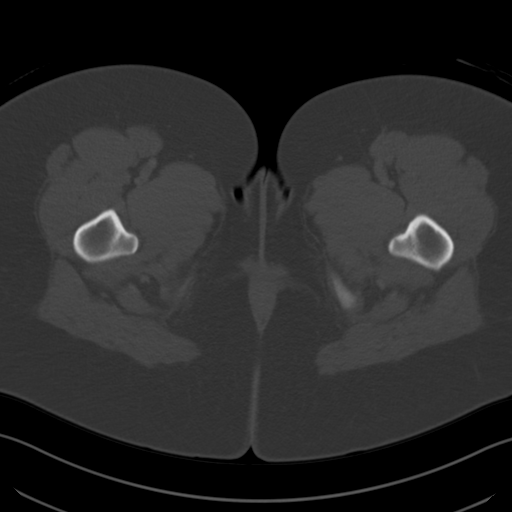
[im 14/98  soft-tissue]
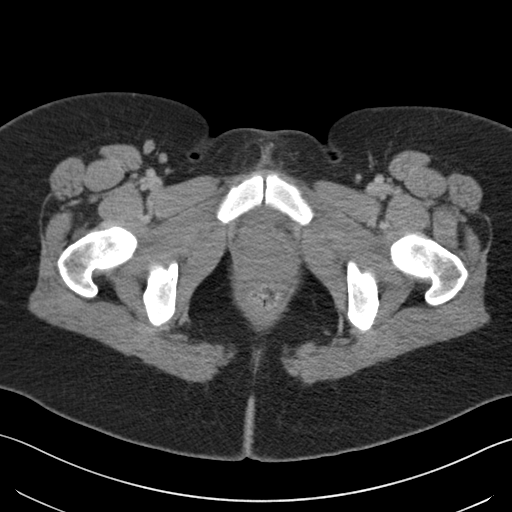
[im 19/98  soft-tissue]
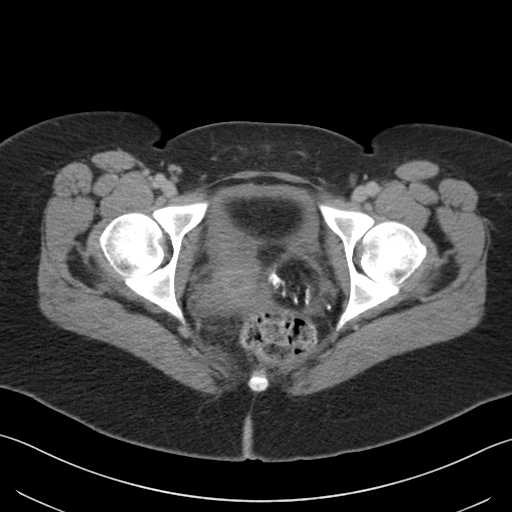
[im 28/98  soft-tissue]
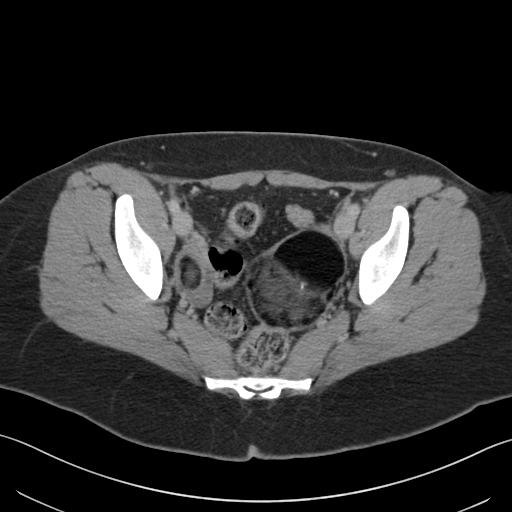
[im 33/98  soft-tissue]
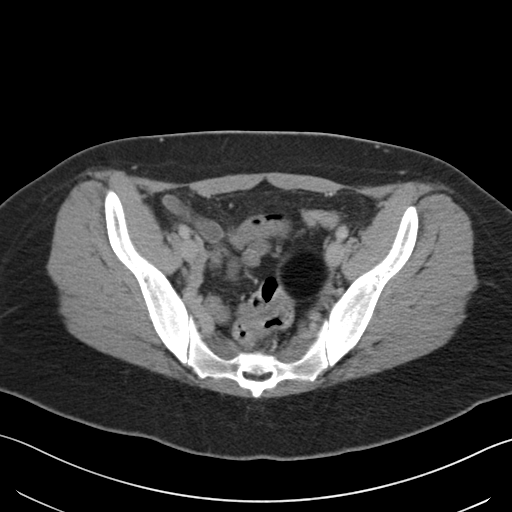
[im 42/98  soft-tissue]
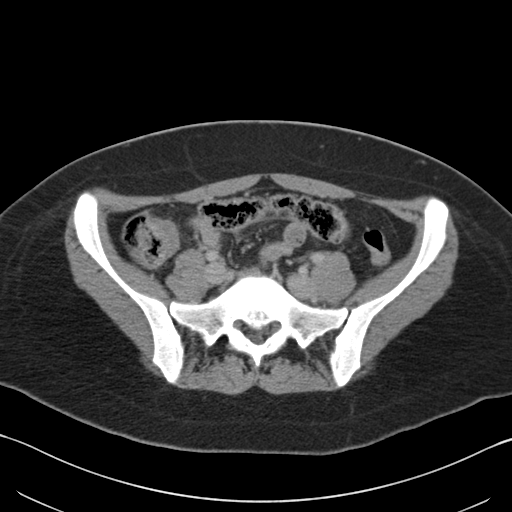
[im 51/98  soft-tissue]
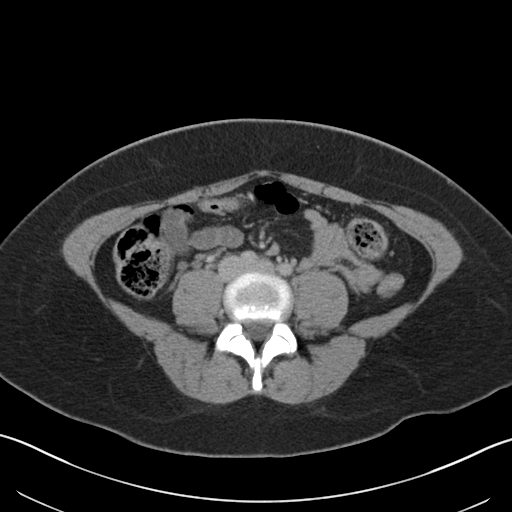
[im 56/98  soft-tissue]
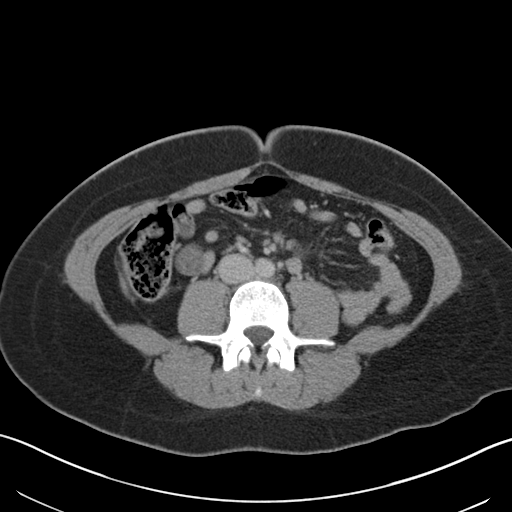
[im 65/98  soft-tissue]
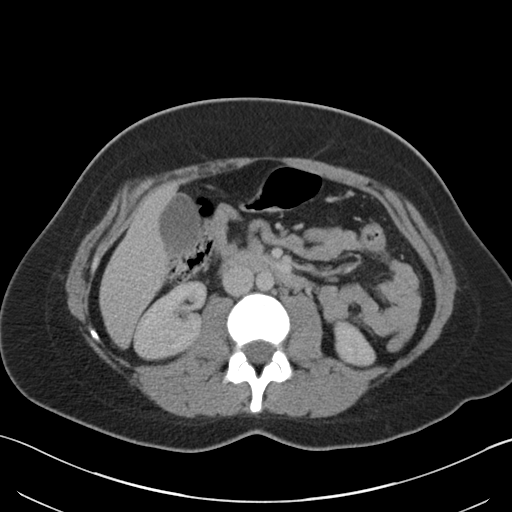
[im 65/98  bone]
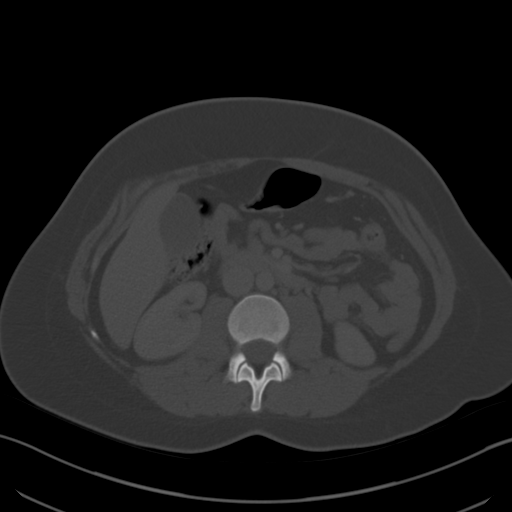
[im 70/98  soft-tissue]
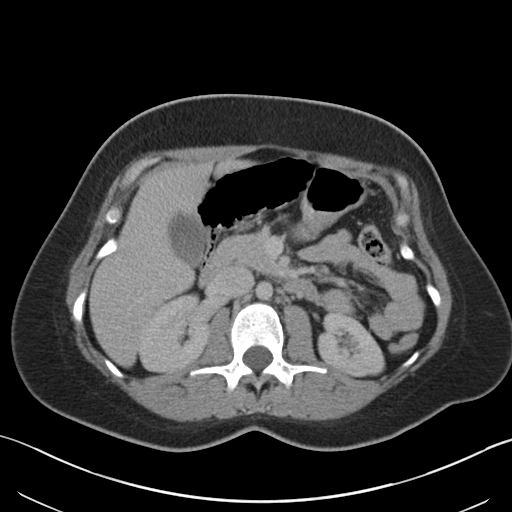
[im 79/98  soft-tissue]
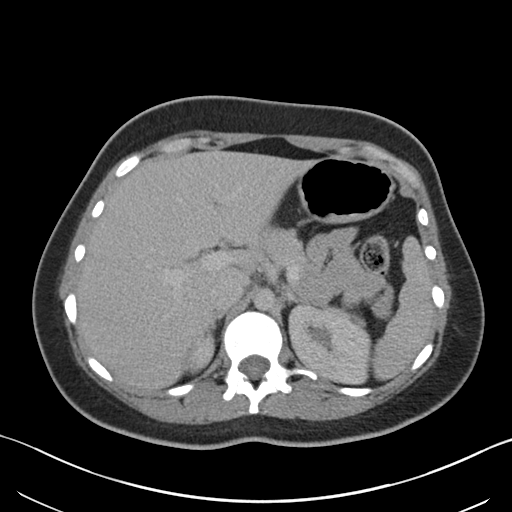
[im 84/98  soft-tissue]
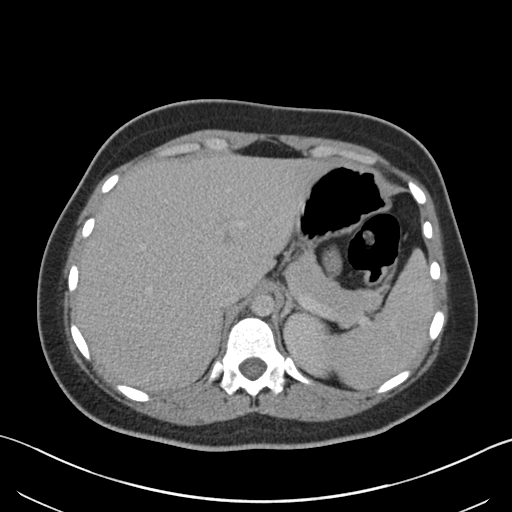
[im 93/98  soft-tissue]
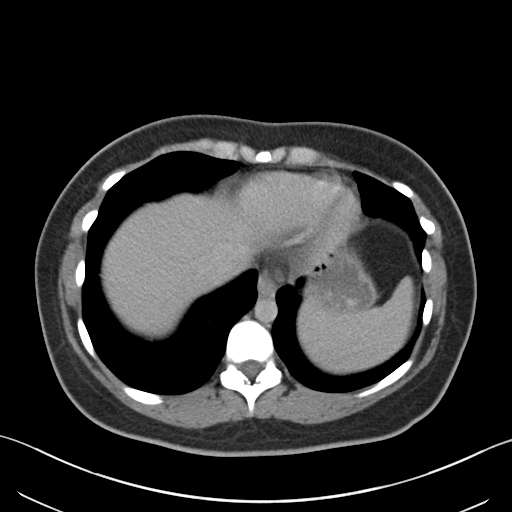

[Series 5: coronal st · coronal · 0.78mm/px · 3 of 141 slices shown]
[im 47/141  soft-tissue]
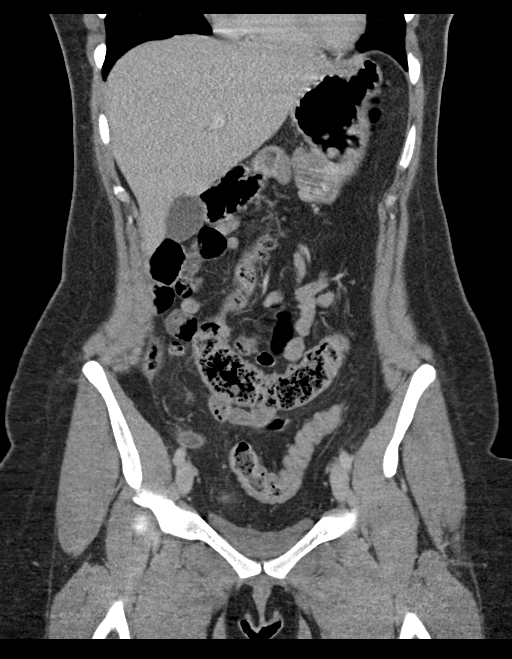
[im 63/141  soft-tissue]
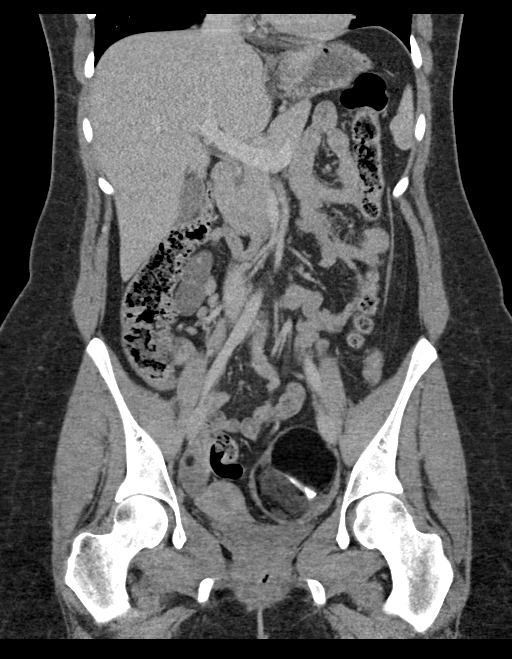
[im 78/141  soft-tissue]
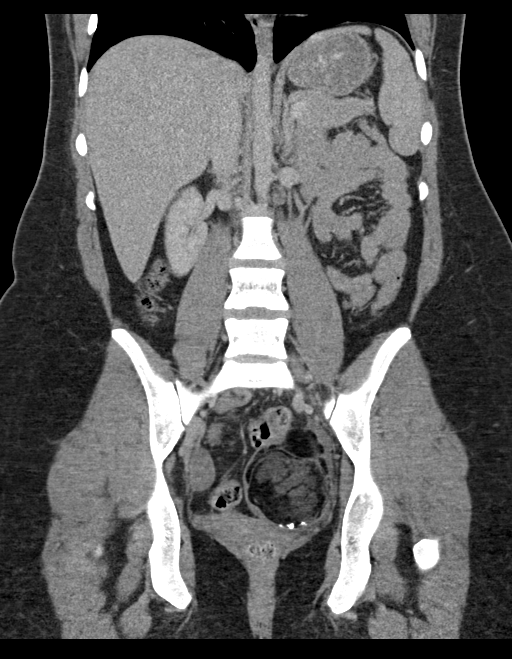

[16 of 46 positions shown; findings below may reference images not displayed]

FINDINGS: LOWER CHEST: Normal.

HEPATOBILIARY: Normal hepatic contours. No intra- or extrahepatic
biliary dilatation. Normal gallbladder

PANCREAS: Normal pancreas. No ductal dilatation or peripancreatic
fluid collection.

SPLEEN: Normal.

ADRENALS/URINARY TRACT: The adrenal glands are normal. No
hydronephrosis, nephroureterolithiasis or solid renal mass. The
urinary bladder is normal for degree of distention

STOMACH/BOWEL: There is no hiatal hernia. Normal duodenal course and
caliber. No small bowel dilatation or inflammation. No focal colonic
abnormality.

Appendix location: Pelvic (5 o'clock)

Diameter: 11 mm

Appendicolith: Yes, sagittal image 81

Mucosal hyper-enhancement: Minimal

Extraluminal gas: None

Periappendiceal collection: None

VASCULAR/LYMPHATIC: Normal course and caliber of the major abdominal
vessels. No abdominal or pelvic lymphadenopathy.

REPRODUCTIVE: There bilateral dermoid cysts. The left measures 8.2 x
6.6 cm and the right measures 2.4 x 1.4 cm.

MUSCULOSKELETAL. No bony spinal canal stenosis or focal osseous
abnormality.

OTHER: None.
IMPRESSION: 1. Dilated appendix with mild mucosal hyper enhancement, consistent
with acute appendicitis. No evidence of perforation or abscess
formation.
2. Bilateral dermoid cysts, measuring up to 8.2 cm on the left and
2.4 cm on the right.
